# Patient Record
Sex: Female | Born: 1975 | Race: White | Hispanic: No | Smoking: Current every day smoker
Health system: Southern US, Community
[De-identification: ages and names within clinical notes are randomized; demographics above are authoritative.]

## PROBLEM LIST (undated history)

## (undated) DIAGNOSIS — F411 Generalized anxiety disorder: Secondary | ICD-10-CM

## (undated) DIAGNOSIS — T8859XA Other complications of anesthesia, initial encounter: Secondary | ICD-10-CM

## (undated) DIAGNOSIS — K649 Unspecified hemorrhoids: Secondary | ICD-10-CM

## (undated) DIAGNOSIS — F419 Anxiety disorder, unspecified: Secondary | ICD-10-CM

## (undated) DIAGNOSIS — F909 Attention-deficit hyperactivity disorder, unspecified type: Secondary | ICD-10-CM

## (undated) DIAGNOSIS — F329 Major depressive disorder, single episode, unspecified: Secondary | ICD-10-CM

## (undated) DIAGNOSIS — J45909 Unspecified asthma, uncomplicated: Secondary | ICD-10-CM

## (undated) DIAGNOSIS — R102 Pelvic and perineal pain: Secondary | ICD-10-CM

## (undated) DIAGNOSIS — N8003 Adenomyosis of the uterus: Secondary | ICD-10-CM

## (undated) HISTORY — DX: Anxiety disorder, unspecified: F41.9

## (undated) HISTORY — PX: TUBAL LIGATION: SHX77

## (undated) HISTORY — PX: TONSILLECTOMY: SUR1361

## (undated) HISTORY — DX: Unspecified hemorrhoids: K64.9

---

## 1998-08-17 HISTORY — PX: TUBAL LIGATION: SHX77

## 2011-10-15 ENCOUNTER — Emergency Department: Payer: Self-pay | Admitting: Internal Medicine

## 2011-10-15 LAB — URINALYSIS, COMPLETE
Glucose,UR: NEGATIVE mg/dL (ref 0–75)
Ketone: NEGATIVE
Leukocyte Esterase: NEGATIVE
Nitrite: NEGATIVE
Ph: 7 (ref 4.5–8.0)
Protein: NEGATIVE
Squamous Epithelial: 1
WBC UR: 2 /HPF (ref 0–5)

## 2011-10-15 LAB — CBC WITH DIFFERENTIAL/PLATELET
Basophil #: 0.1 10*3/uL (ref 0.0–0.1)
Eosinophil #: 0.1 10*3/uL (ref 0.0–0.7)
HCT: 45 % (ref 35.0–47.0)
Lymphocyte %: 31.8 %
MCH: 31.8 pg (ref 26.0–34.0)
MCHC: 33.8 g/dL (ref 32.0–36.0)
MCV: 94 fL (ref 80–100)
Neutrophil #: 4.5 10*3/uL (ref 1.4–6.5)
Platelet: 268 10*3/uL (ref 150–440)
RDW: 12.5 % (ref 11.5–14.5)

## 2011-10-15 LAB — COMPREHENSIVE METABOLIC PANEL
Albumin: 4.3 g/dL (ref 3.4–5.0)
Alkaline Phosphatase: 70 U/L (ref 50–136)
Anion Gap: 14 (ref 7–16)
BUN: 6 mg/dL — ABNORMAL LOW (ref 7–18)
Bilirubin,Total: 0.2 mg/dL (ref 0.2–1.0)
Calcium, Total: 8.7 mg/dL (ref 8.5–10.1)
Co2: 24 mmol/L (ref 21–32)
Creatinine: 0.7 mg/dL (ref 0.60–1.30)
EGFR (African American): >60
EGFR (Non-African Amer.): >60
Glucose: 81 mg/dL (ref 65–99)
Potassium: 3.5 mmol/L (ref 3.5–5.1)
SGOT(AST): 21 U/L (ref 15–37)

## 2011-10-15 LAB — HCG, QUANTITATIVE, PREGNANCY: Beta Hcg, Quant.: <1 m[IU]/mL — ABNORMAL LOW

## 2016-06-08 DIAGNOSIS — F3489 Other specified persistent mood disorders: Secondary | ICD-10-CM | POA: Diagnosis not present

## 2016-06-08 DIAGNOSIS — F39 Unspecified mood [affective] disorder: Secondary | ICD-10-CM | POA: Diagnosis not present

## 2016-06-24 DIAGNOSIS — F39 Unspecified mood [affective] disorder: Secondary | ICD-10-CM | POA: Diagnosis not present

## 2016-07-29 DIAGNOSIS — F39 Unspecified mood [affective] disorder: Secondary | ICD-10-CM | POA: Diagnosis not present

## 2016-07-30 DIAGNOSIS — F411 Generalized anxiety disorder: Secondary | ICD-10-CM | POA: Diagnosis not present

## 2016-07-30 DIAGNOSIS — F39 Unspecified mood [affective] disorder: Secondary | ICD-10-CM | POA: Diagnosis not present

## 2016-09-28 DIAGNOSIS — F39 Unspecified mood [affective] disorder: Secondary | ICD-10-CM | POA: Diagnosis not present

## 2017-09-29 ENCOUNTER — Emergency Department (HOSPITAL_COMMUNITY)
Admission: EM | Admit: 2017-09-29 | Discharge: 2017-09-29 | Disposition: A | Discharge status: Home / Self Care | Payer: BLUE CROSS/BLUE SHIELD | Attending: Emergency Medicine | Admitting: Emergency Medicine

## 2017-09-29 ENCOUNTER — Other Ambulatory Visit: Payer: Self-pay

## 2017-09-29 ENCOUNTER — Encounter (HOSPITAL_COMMUNITY): Payer: Self-pay | Admitting: *Deleted

## 2017-09-29 ENCOUNTER — Emergency Department (HOSPITAL_COMMUNITY): Payer: BLUE CROSS/BLUE SHIELD

## 2017-09-29 DIAGNOSIS — K921 Melena: Secondary | ICD-10-CM | POA: Insufficient documentation

## 2017-09-29 DIAGNOSIS — F17210 Nicotine dependence, cigarettes, uncomplicated: Secondary | ICD-10-CM | POA: Diagnosis not present

## 2017-09-29 DIAGNOSIS — R109 Unspecified abdominal pain: Secondary | ICD-10-CM | POA: Diagnosis not present

## 2017-09-29 DIAGNOSIS — R1110 Vomiting, unspecified: Secondary | ICD-10-CM | POA: Diagnosis not present

## 2017-09-29 DIAGNOSIS — R1013 Epigastric pain: Secondary | ICD-10-CM | POA: Insufficient documentation

## 2017-09-29 DIAGNOSIS — R112 Nausea with vomiting, unspecified: Secondary | ICD-10-CM | POA: Diagnosis not present

## 2017-09-29 LAB — COMPREHENSIVE METABOLIC PANEL
ALBUMIN: 4 g/dL (ref 3.5–5.0)
ALK PHOS: 85 U/L (ref 38–126)
ALT: 12 U/L — ABNORMAL LOW (ref 14–54)
AST: 18 U/L (ref 15–41)
Anion gap: 10 (ref 5–15)
BUN: 7 mg/dL (ref 6–20)
CALCIUM: 9.3 mg/dL (ref 8.9–10.3)
CHLORIDE: 104 mmol/L (ref 101–111)
CO2: 24 mmol/L (ref 22–32)
Creatinine, Ser: 0.83 mg/dL (ref 0.44–1.00)
GFR calc Af Amer: >60 mL/min (ref >60)
GFR calc non Af Amer: >60 mL/min (ref >60)
GLUCOSE: 112 mg/dL — AB (ref 65–99)
Potassium: 3.8 mmol/L (ref 3.5–5.1)
SODIUM: 138 mmol/L (ref 135–145)
Total Bilirubin: 0.6 mg/dL (ref 0.3–1.2)
Total Protein: 7.5 g/dL (ref 6.5–8.1)

## 2017-09-29 LAB — LIPASE, BLOOD: Lipase: 32 U/L (ref 11–51)

## 2017-09-29 LAB — URINALYSIS, ROUTINE W REFLEX MICROSCOPIC
Bacteria, UA: NONE SEEN
Bilirubin Urine: NEGATIVE
GLUCOSE, UA: NEGATIVE mg/dL
KETONES UR: 20 mg/dL — AB
Leukocytes, UA: NEGATIVE
Nitrite: NEGATIVE
PH: 5 (ref 5.0–8.0)
PROTEIN: NEGATIVE mg/dL
RBC / HPF: NONE SEEN RBC/hpf (ref 0–5)
Specific Gravity, Urine: >1.046 — ABNORMAL HIGH (ref 1.005–1.030)
WBC UA: NONE SEEN WBC/hpf (ref 0–5)

## 2017-09-29 LAB — CBC
HCT: 45.1 % (ref 36.0–46.0)
Hemoglobin: 15.4 g/dL — ABNORMAL HIGH (ref 12.0–15.0)
MCH: 30.9 pg (ref 26.0–34.0)
MCHC: 34.1 g/dL (ref 30.0–36.0)
MCV: 90.6 fL (ref 78.0–100.0)
PLATELETS: 347 10*3/uL (ref 150–400)
RBC: 4.98 MIL/uL (ref 3.87–5.11)
RDW: 14.1 % (ref 11.5–15.5)
WBC: 12.1 10*3/uL — ABNORMAL HIGH (ref 4.0–10.5)

## 2017-09-29 LAB — POC OCCULT BLOOD, ED: FECAL OCCULT BLD: POSITIVE — AB

## 2017-09-29 LAB — I-STAT BETA HCG BLOOD, ED (MC, WL, AP ONLY): I-stat hCG, quantitative: <5 m[IU]/mL (ref <5)

## 2017-09-29 MED ORDER — ONDANSETRON HCL 4 MG/2ML IJ SOLN
4.0000 mg | Freq: Once | INTRAMUSCULAR | Status: AC
  Administered 2017-09-29: 4 mg via INTRAVENOUS
  Filled 2017-09-29: qty 2

## 2017-09-29 MED ORDER — HYDROMORPHONE HCL 1 MG/ML IJ SOLN
0.5000 mg | Freq: Once | INTRAMUSCULAR | Status: AC
  Administered 2017-09-29: 0.5 mg via INTRAVENOUS
  Filled 2017-09-29: qty 1

## 2017-09-29 MED ORDER — ONDANSETRON 4 MG PO TBDP: 4.0000 mg | ORAL_TABLET | Freq: Three times a day (TID) | ORAL | 0 refills | Status: DC | PRN

## 2017-09-29 MED ORDER — GI COCKTAIL ~~LOC~~
30.0000 mL | Freq: Once | ORAL | Status: AC
  Administered 2017-09-29: 30 mL via ORAL
  Filled 2017-09-29: qty 30

## 2017-09-29 MED ORDER — SODIUM CHLORIDE 0.9 % IV BOLUS (SEPSIS)
1000.0000 mL | Freq: Once | INTRAVENOUS | Status: AC
  Administered 2017-09-29: 1000 mL via INTRAVENOUS

## 2017-09-29 MED ORDER — FAMOTIDINE IN NACL 20-0.9 MG/50ML-% IV SOLN
20.0000 mg | Freq: Once | INTRAVENOUS | Status: AC
  Administered 2017-09-29: 20 mg via INTRAVENOUS
  Filled 2017-09-29: qty 50

## 2017-09-29 MED ORDER — IOPAMIDOL (ISOVUE-300) INJECTION 61%
INTRAVENOUS | Status: AC
  Administered 2017-09-29: 100 mL
  Filled 2017-09-29: qty 100

## 2017-09-29 MED ORDER — OMEPRAZOLE 20 MG PO CPDR: 20.0000 mg | DELAYED_RELEASE_CAPSULE | Freq: Every day | ORAL | 0 refills | Status: DC

## 2017-09-29 NOTE — Discharge Instructions (Signed)
Please read instructions below. Schedule an appointment with the Gastroenterologist for further evaluation of your abdominal pain and blood in your stool.  Begin taking omeprazole daily. You can take tylenol as needed for pain. Avoid spicy, acidic, greasy foods. Avoid NSAIDs such as advil/motrin/ibuprofen, aleve, goody's powder, as these can be hard on the stomach lining.  Return to the ER for severely worsening pain, fever, or new or concerning symptoms.

## 2017-09-29 NOTE — ED Notes (Signed)
Patient drank fluid without any incident

## 2017-09-29 NOTE — ED Provider Notes (Signed)
[facility] EMERGENCY DEPARTMENT Provider Note   CSN: [id number] Arrival date & time: 09/29/17  4782     History   Chief Complaint Chief Complaint  Patient presents with  . Abdominal Pain    HPI Sheri Flores is a 42 y.o. female without significant past medical history, presenting to the ED for acute onset of epigastric abdominal pain that began yesterday.  Patient states pain is constant ache/burning and intermittently sharp, worse with meals.  She reports associated nausea and nonbilious/nonbloody emesis.  She also states she had a loose stool this morning that looked very dark/black.  No medications tried for symptoms.  She denies frequent NSAID use, alcohol use, history of GI bleed, or hx of GERD. Does endorse hx of internal hemorrhoids. Only abdominal surgery includes a tubal ligation.  Denies fever or chills, urinary sx, vaginal bleeding or discharge.  The history is provided by the patient.    History reviewed. No pertinent past medical history.  There are no active problems to display for this patient.   History reviewed. No pertinent surgical history.  OB History    No data available       Home Medications    Prior to Admission medications   Medication Sig Start Date End Date Taking? Authorizing Provider  omeprazole (PRILOSEC) 20 MG capsule Take 1 capsule (20 mg total) by mouth daily. 09/29/17   Arleene Settle, [place], PA-C  ondansetron (ZOFRAN ODT) 4 MG disintegrating tablet Take 1 tablet (4 mg total) by mouth every 8 (eight) hours as needed for nausea or vomiting. 09/29/17   Kelsea Mousel, [place], PA-C    Family History History reviewed. No pertinent family history.  Social History Social History   Tobacco Use  . Smoking status: Current Every Day Smoker  Substance Use Topics  . Alcohol use: No    Frequency: Never  . Drug use: No     Allergies   Patient has no known allergies.   Review of Systems Review of Systems  Constitutional:  Negative for chills and fever.  Gastrointestinal: Positive for abdominal pain, blood in stool, nausea and vomiting.  Genitourinary: Negative for dysuria, frequency, vaginal bleeding and vaginal discharge.  All other systems reviewed and are negative.    Physical Exam Updated Vital Signs BP 105/71 (BP Location: Left Arm)   Pulse 84   Temp 98.4 F (36.9 C) (Oral)   Resp 18   LMP 09/22/2017   SpO2 99%   Physical Exam  Constitutional: She appears well-developed and well-nourished. No distress.  HENT:  Head: Normocephalic and atraumatic.  Mouth/Throat: Oropharynx is clear and moist.  Eyes: Conjunctivae are normal.  Cardiovascular: Normal rate, regular rhythm, normal heart sounds and intact distal pulses.  Pulmonary/Chest: Effort normal and breath sounds normal. No respiratory distress.  Abdominal: Soft. Normal appearance and bowel sounds are normal. She exhibits no distension and no mass. There is tenderness in the epigastric area, periumbilical area and left upper quadrant. There is no rigidity, no rebound, no guarding and negative Murphy's sign. No hernia.  Neurological: She is alert.  Skin: Skin is warm.  Psychiatric: She has a normal mood and affect. Her behavior is normal.  Nursing note and vitals reviewed.    ED Treatments / Results  Labs (all labs ordered are listed, but only abnormal results are displayed) Labs Reviewed  COMPREHENSIVE METABOLIC PANEL - Abnormal; Notable for the following components:      Result Value   Glucose, Bld 112 (*)  ALT 12 (*)    All other components within normal limits  CBC - Abnormal; Notable for the following components:   WBC 12.1 (*)    Hemoglobin 15.4 (*)    All other components within normal limits  URINALYSIS, ROUTINE W REFLEX MICROSCOPIC - Abnormal; Notable for the following components:   Specific Gravity, Urine >1.046 (*)    Hgb urine dipstick SMALL (*)    Ketones, ur 20 (*)    Squamous Epithelial / LPF 0-5 (*)    All other  components within normal limits  POC OCCULT BLOOD, ED - Abnormal; Notable for the following components:   Fecal Occult Bld POSITIVE (*)    All other components within normal limits  LIPASE, BLOOD  I-STAT BETA HCG BLOOD, ED ([facility], [facility], [facility] ONLY)    EKG  EKG Interpretation None       Radiology Ct Abdomen Pelvis W Contrast  Result Date: 09/29/2017 CLINICAL DATA:  Mid abdominal pain, nausea, vomiting EXAM: CT ABDOMEN AND PELVIS WITH CONTRAST TECHNIQUE: Multidetector CT imaging of the abdomen and pelvis was performed using the standard protocol following bolus administration of intravenous contrast. CONTRAST:  100mL ISOVUE-300 IOPAMIDOL (ISOVUE-300) INJECTION 61% COMPARISON:  None. FINDINGS: Lower chest: Lung bases are clear. No effusions. Heart is normal size. Hepatobiliary: No focal hepatic abnormality. Gallbladder unremarkable. Pancreas: No focal abnormality or ductal dilatation. Spleen: No focal abnormality.  Normal size. Adrenals/Urinary Tract: No adrenal abnormality. No focal renal abnormality. No stones or hydronephrosis. Urinary bladder is unremarkable. Stomach/Bowel: Stomach, large and small bowel grossly unremarkable. Appendix is normal Vascular/Lymphatic: No evidence of aneurysm or adenopathy. Reproductive: Uterus and adnexa unremarkable.  No mass. Other: No free fluid or free air. Musculoskeletal: No acute bony abnormality. IMPRESSION: No acute findings in the abdomen or pelvis. Electronically Signed   By: Charlett NoseKevin  Dover M.D.   On: 09/29/2017 13:21    Procedures Procedures (including critical care time)  Medications Ordered in ED Medications  sodium chloride 0.9 % bolus 1,000 mL (0 mLs Intravenous Stopped 09/29/17 1341)  ondansetron (ZOFRAN) injection 4 mg (4 mg Intravenous Given 09/29/17 1226)  famotidine (PEPCID) IVPB 20 mg premix (0 mg Intravenous Stopped 09/29/17 1257)  HYDROmorphone (DILAUDID) injection 0.5 mg (0.5 mg Intravenous Given 09/29/17 1227)  iopamidol (ISOVUE-300) 61 %  injection (100 mLs  Contrast Given 09/29/17 1230)  gi cocktail (Maalox,Lidocaine,Donnatal) (30 mLs Oral Given 09/29/17 1345)     Initial Impression / Assessment and Plan / ED Course  I have reviewed the triage vital signs and the nursing notes.  Pertinent labs & imaging results that were available during my care of the patient were reviewed by me and considered in my medical decision making (see chart for details).     Pt presenting with acute onset of epigastric abdominal pain, N/V, with melena. Pt well-appearing, afebrile, not in distress. Abdominal exam with epigastric/LUQ tenderness, no guarding, abdomen soft. Labs with slight leukocytosis, stable hgb, positive hemoccult, U/A neg for infection. CT abd neg for acute pathology. Suspect PUD as cause of abdominal pain and melena. IVF bolus given, pepcid, zofran and dilaudid ordered in triage. Pt tolerating liquids. Will discharge patient with GI referral, and recommendation to take PPI daily. Diet modifications also discussed. Safe for discharge.  Patient discussed with Dr. Phineas RealMabe.  Discussed results, findings, treatment and follow up. Patient advised of return precautions. Patient verbalized understanding and agreed with plan.   Final Clinical Impressions(s) / ED Diagnoses   Final diagnoses:  Melena  Epigastric abdominal pain  ED Discharge Orders        Ordered    omeprazole (PRILOSEC) 20 MG capsule  Daily     09/29/17 1337    ondansetron (ZOFRAN ODT) 4 MG disintegrating tablet  Every 8 hours PRN     09/29/17 1341       Adelaine Roppolo, [place] N, [place] 09/29/17 1420    Mabe, Latanya Maudlin, MD 09/29/17 1421

## 2017-09-29 NOTE — ED Notes (Signed)
D/c reviewed with patient and spouse. No further questions at this time 

## 2017-09-29 NOTE — ED Triage Notes (Signed)
Pt reports onset yesterday of mid abd pain. Has n/v and reports dark loose stools. No acute distress is noted at triage.

## 2017-09-29 NOTE — ED Notes (Signed)
E-signature not available at this time 

## 2017-09-29 NOTE — ED Notes (Signed)
Patient transported to CT 

## 2017-09-29 NOTE — ED Notes (Signed)
ED Provider at bedside. 

## 2017-09-29 NOTE — ED Provider Notes (Signed)
Patient w brief screening exam. C/o epigastric pain, n/v, and states stool looked dark/almost black this AM. No hx gi bleeding. No nsaid use. No etoh abuse. No fever. abd soft mild epigastric tenderness. Stool sent for hemoccult (chaperoned exam). Iv ns bolus. Zofran. Labs sent.    [name], [name], MD 09/29/17 1048

## 2018-04-20 NOTE — Progress Notes (Deleted)
   Subjective:    Patient ID: Sheri Flores, female    DOB: 07/04/1976, 42 y.o.   MRN: [id number]  HPI:  Sheri Flores is here to establish as a new pt.  She is a pleasant 42 tear old female. PMH:  Patient Care Team    Relationship Specialty Notifications Start End  [place], Jinny Blossom, NP PCP - General Family Medicine  03/22/18     There are no active problems to display for this patient.    No past medical history on file.   No past surgical history on file.   No family history on file.   Social History   Substance and Sexual Activity  Drug Use No     Social History   Substance and Sexual Activity  Alcohol Use No  . Frequency: Never     Social History   Tobacco Use  Smoking Status Current Every Day Smoker     Outpatient Encounter Medications as of 04/21/2018  Medication Sig  . omeprazole (PRILOSEC) 20 MG capsule Take 1 capsule (20 mg total) by mouth daily.  . ondansetron (ZOFRAN ODT) 4 MG disintegrating tablet Take 1 tablet (4 mg total) by mouth every 8 (eight) hours as needed for nausea or vomiting.   No facility-administered encounter medications on file as of 04/21/2018.     Allergies: Patient has no known allergies.  There is no height or weight on file to calculate BMI.  There were no vitals taken for this visit.     Review of Systems     Objective:   Physical Exam        Assessment & Plan:  No diagnosis found.  No problem-specific Assessment & Plan notes found for this encounter.    FOLLOW-UP:  No follow-ups on file.

## 2018-04-21 ENCOUNTER — Ambulatory Visit: Payer: BLUE CROSS/BLUE SHIELD | Admitting: Adult Health

## 2018-05-04 NOTE — Progress Notes (Signed)
Subjective:    Patient ID: Sheri Flores, female    DOB: 1976/02/04, 42 y.o.   MRN: [id number]  HPI: Sheri Flores is here to establish as a new pt.  She is a pleasant 42 year old female.   She presents with several acute complaints 1) R flank pain that is constant, described as "throbbing/ache" , rated 8/10 that started 72 hrs ago She also reports urinary frequency, dysuria, and hematuria 2) Diffuse abdominal pain, poor appetite for >4 weeks.  She reports loose stools with intermittent hematochezia that last two weeks. She also reports constant low grade nausea with intermittent vomiting the last two weeks. She denies fever, has been experiencing chills/night sweats.  She has never been evaluated by GI. 3) Long standing tobacco use >20 years, down to 10 cigarettes/day 4) Poor dentition- had all of lower teeth extracted Feb 2019.  Planning on upper row extraction and eventual full dentures. 5) Unintentional weight loss, today's wt 120, she reports her typical wt is 135-140 She denies recent travel outside [place] She denies being exposed to anyone with known TB She is unable to work due to poor dentition [employer] is her primary source of hydration, she denies ETOH use She reports occasional marijuana use, last >4 weeks ago She denies ever using Methamphetamine She reports insomnia She is married with 3 children  Patient Care Team    Relationship Specialty Notifications Start End  [place]ph[place] Battisti, Jinny Blossom, NP PCP - General Family Medicine  03/22/18     Patient Active Problem List   Diagnosis Date Noted  . Healthcare maintenance 05/09/2018  . Loose stools 05/09/2018  . Abnormal urinalysis 05/09/2018     History reviewed. No pertinent past medical history.   Past Surgical History:  Procedure Laterality Date  . TONSILLECTOMY    . TUBAL LIGATION       Family History  Problem Relation Age of Onset  . Cancer Paternal Grandmother        breast     Social History   Substance and  Sexual Activity  Drug Use Yes  . Types: Marijuana     Social History   Substance and Sexual Activity  Alcohol Use No  . Frequency: Never     Social History   Tobacco Use  Smoking Status Current Every Day Smoker  . Packs/day: 0.50  . Years: 20.00  . Pack years: 10.00  . Types: Cigarettes  Smokeless Tobacco Never Used     Outpatient Encounter Medications as of 05/09/2018  Medication Sig  . ondansetron (ZOFRAN-ODT) 4 MG disintegrating tablet Take 1 tablet (4 mg total) by mouth every 8 (eight) hours as needed for nausea or vomiting.  . sulfamethoxazole-trimethoprim (BACTRIM DS,SEPTRA DS) 800-160 MG tablet Take 1 tablet by mouth 2 (two) times daily.  . [DISCONTINUED] omeprazole (PRILOSEC) 20 MG capsule Take 1 capsule (20 mg total) by mouth daily.  . [DISCONTINUED] ondansetron (ZOFRAN ODT) 4 MG disintegrating tablet Take 1 tablet (4 mg total) by mouth every 8 (eight) hours as needed for nausea or vomiting.   No facility-administered encounter medications on file as of 05/09/2018.     Allergies: Hydrocodone  Body mass index is 20.63 kg/m.  Blood pressure 135/86, pulse 81, height 5\' 4"  (1.626 m), weight 120 lb 3.2 oz (54.5 kg), last menstrual period 04/22/2018, SpO2 100 %.  Review of Systems  Constitutional: Positive for activity change, appetite change, chills, fatigue and unexpected weight change. Negative for diaphoresis and fever.  HENT: Positive for dental  problem. Negative for congestion.   Eyes: Negative for visual disturbance.  Respiratory: Negative for cough, chest tightness, shortness of breath, wheezing and stridor.   Cardiovascular: Negative for chest pain, palpitations and leg swelling.  Gastrointestinal: Positive for abdominal pain, blood in stool, diarrhea, nausea and vomiting. Negative for abdominal distention and constipation.  Endocrine: Negative for cold intolerance, heat intolerance, polydipsia, polyphagia and polyuria.  Genitourinary: Positive for  dysuria, frequency, hematuria and urgency.  Neurological: Positive for dizziness. Negative for headaches.  Hematological: Does not bruise/bleed easily.  Psychiatric/Behavioral: Positive for sleep disturbance.       Objective:   Physical Exam  Constitutional: She is oriented to person, place, and time. She appears well-developed and well-nourished. No distress.  HENT:  Head: Normocephalic and atraumatic.  Right Ear: External ear normal.  Left Ear: External ear normal.  Nose: Nose normal.  Mouth/Throat: Oropharynx is clear and moist. She has dentures. Abnormal dentition. Dental caries present.  Eyes: Pupils are equal, round, and reactive to light. Conjunctivae and EOM are normal.  Cardiovascular: Normal rate, regular rhythm, normal heart sounds and intact distal pulses.  No murmur heard. Pulmonary/Chest: Effort normal. No stridor. No respiratory distress. She has wheezes. She has no rales. She exhibits no tenderness.  Abdominal: Soft. Bowel sounds are normal. She exhibits no distension and no mass. There is generalized tenderness. There is CVA tenderness. There is no rebound and no guarding. No hernia.  Neurological: She is alert and oriented to person, place, and time.  Skin: Skin is warm and dry. Capillary refill takes less than 2 seconds. No rash noted. She is not diaphoretic. No erythema. No pallor.  Psychiatric: She has a normal mood and affect. Her behavior is normal. Judgment and thought content normal.  Nursing note and vitals reviewed.     Assessment & Plan:   1. Healthcare maintenance   2. Screening for breast cancer   3. Need for Tdap vaccination   4. Screening for HIV (human immunodeficiency virus)   5. Lower abdominal pain   6. Hematochezia   7. Loose stools   8. Abnormal urinalysis     Abnormal urinalysis UA Blood- small Nit - pos Leu - neg Urine sent for C/S Due to sig flank pain, started on Bactrim 800/160 BID x 7 days Push fluids  Healthcare  maintenance Increase plain water intake and follow BRAT Diet. Please take Bactrim as directed. Please take Ondansetron as needed. Referral to Gastroenterologist placed, re: abdominal pain, loose stools, and poor appetite. We will call you when lab results are available. Follow-up in 4 weeks for complete physical.  Loose stools Referral to GI placed  Pt was in the office today for 45+ minutes, I spent >75% of time in face to face counseling of various medical concerns and in coordination of care  FOLLOW-UP:  Return in about 4 weeks (around 06/06/2018) for CPE.

## 2018-05-09 ENCOUNTER — Ambulatory Visit (INDEPENDENT_AMBULATORY_CARE_PROVIDER_SITE_OTHER): Payer: BLUE CROSS/BLUE SHIELD | Admitting: Adult Health

## 2018-05-09 ENCOUNTER — Other Ambulatory Visit: Payer: Self-pay | Admitting: Adult Health

## 2018-05-09 ENCOUNTER — Encounter: Payer: Self-pay | Admitting: Adult Health

## 2018-05-09 VITALS — BP 135/86 | HR 81 | Ht 64.0 in | Wt 120.2 lb

## 2018-05-09 DIAGNOSIS — R195 Other fecal abnormalities: Secondary | ICD-10-CM | POA: Insufficient documentation

## 2018-05-09 DIAGNOSIS — Z114 Encounter for screening for human immunodeficiency virus [HIV]: Secondary | ICD-10-CM

## 2018-05-09 DIAGNOSIS — Z23 Encounter for immunization: Secondary | ICD-10-CM

## 2018-05-09 DIAGNOSIS — K921 Melena: Secondary | ICD-10-CM

## 2018-05-09 DIAGNOSIS — R1030 Lower abdominal pain, unspecified: Secondary | ICD-10-CM

## 2018-05-09 DIAGNOSIS — Z1231 Encounter for screening mammogram for malignant neoplasm of breast: Secondary | ICD-10-CM | POA: Diagnosis not present

## 2018-05-09 DIAGNOSIS — R8290 Unspecified abnormal findings in urine: Secondary | ICD-10-CM | POA: Insufficient documentation

## 2018-05-09 DIAGNOSIS — Z1239 Encounter for other screening for malignant neoplasm of breast: Secondary | ICD-10-CM

## 2018-05-09 DIAGNOSIS — Z0000 Encounter for general adult medical examination without abnormal findings: Secondary | ICD-10-CM | POA: Insufficient documentation

## 2018-05-09 LAB — POCT URINALYSIS DIPSTICK
Bilirubin, UA: NEGATIVE
Glucose, UA: NEGATIVE
KETONES UA: 15
Leukocytes, UA: NEGATIVE
NITRITE UA: POSITIVE
PH UA: 6 (ref 5.0–8.0)
PROTEIN UA: POSITIVE — AB
UROBILINOGEN UA: 0.2 U/dL

## 2018-05-09 MED ORDER — ONDANSETRON 4 MG PO TBDP: 4.0000 mg | ORAL_TABLET | Freq: Three times a day (TID) | ORAL | 0 refills | Status: DC | PRN

## 2018-05-09 MED ORDER — SULFAMETHOXAZOLE-TRIMETHOPRIM 800-160 MG PO TABS: 1.0000 | ORAL_TABLET | Freq: Two times a day (BID) | ORAL | 0 refills | Status: DC

## 2018-05-09 NOTE — Assessment & Plan Note (Signed)
UA Blood- small Nit - pos Leu - neg Urine sent for C/S Due to sig flank pain, started on Bactrim 800/160 BID x 7 days Push fluids

## 2018-05-09 NOTE — Assessment & Plan Note (Signed)
Referral to GI placed

## 2018-05-09 NOTE — Patient Instructions (Addendum)
Bland Diet A bland diet consists of foods that do not have a lot of fat or fiber. Foods without fat or fiber are easier for the body to digest. They are also less likely to irritate your mouth, throat, stomach, and other parts of your gastrointestinal tract. A bland diet is sometimes called a BRAT diet. What is my plan? Your health care provider or dietitian may recommend specific changes to your diet to prevent and treat your symptoms, such as:  Eating small meals often.  Cooking food until it is soft enough to chew easily.  Chewing your food well.  Drinking fluids slowly.  Not eating foods that are very spicy, sour, or fatty.  Not eating citrus fruits, such as oranges and grapefruit.  What do I need to know about this diet?  Eat a variety of foods from the bland diet food list.  Do not follow a bland diet longer than you have to.  Ask your health care provider whether you should take vitamins. What foods can I eat? Grains  Hot cereals, such as cream of wheat. Bread, crackers, or tortillas made from refined white flour. Rice. Vegetables Canned or cooked vegetables. Mashed or boiled potatoes. Fruits Bananas. Applesauce. Other types of cooked or canned fruit with the skin and seeds removed, such as canned peaches or pears. Meats and Other Protein Sources Scrambled eggs. Creamy peanut butter or other nut butters. Lean, well-cooked meats, such as chicken or fish. Tofu. Soups or broths. Dairy Low-fat dairy products, such as milk, cottage cheese, or yogurt. Beverages Water. Herbal tea. Apple juice. Sweets and Desserts Pudding. Custard. Fruit gelatin. Ice cream. Fats and Oils Mild salad dressings. Canola or olive oil. The items listed above may not be a complete list of allowed foods or beverages. Contact your dietitian for more options. What foods are not recommended? Foods and ingredients that are often not recommended include:  Spicy foods, such as hot sauce or  salsa.  Fried foods.  Sour foods, such as pickled or fermented foods.  Raw vegetables or fruits, especially citrus or berries.  Caffeinated drinks.  Alcohol.  Strongly flavored seasonings or condiments.  The items listed above may not be a complete list of foods and beverages that are not allowed. Contact your dietitian for more information. This information is not intended to replace advice given to you by your health care provider. Make sure you discuss any questions you have with your health care provider. Document Released: 11/25/2015 Document Revised: 01/09/2016 Document Reviewed: 08/15/2014 Elsevier Interactive Patient Education  2018 [employer]   Urinary Frequency, Adult Urinary frequency means urinating more often than usual. People with urinary frequency urinate at least 8 times in 24 hours, even if they drink a normal amount of fluid. Although they urinate more often than normal, the total amount of urine produced in a day may be normal. Urinary frequency is also called pollakiuria. What are the causes? This condition may be caused by:  A urinary tract infection.  Obesity.  Bladder problems, such as bladder stones.  Caffeine or alcohol.  Eating food or drinking fluids that irritate the bladder. These include coffee, tea, soda, artificial sweeteners, citrus, tomato-based foods, and chocolate.  Certain medicines, such as medicines that help the body get rid of extra fluid (diuretics).  Muscle or nerve weakness.  Overactive bladder.  Chronic diabetes.  Interstitial cystitis.  In men, problems with the prostate, such as an enlarged prostate.  In women, pregnancy.  In some cases, the cause may  not be known. What increases the risk? This condition is more likely to develop in:  Women who have gone through menopause.  Men with prostate problems.  People with a disease or injury that affects the nerves or spinal cord.  People who have or have had a  condition that affects the brain, such as a stroke.  What are the signs or symptoms? Symptoms of this condition include:  Feeling an urgent need to urinate often. The stress and anxiety of needing to find a bathroom quickly can make this urge worse.  Urinating 8 or more times in 24 hours.  Urinating as often as every 1 to 2 hours.  How is this diagnosed? This condition is diagnosed based on your symptoms, your medical history, and a physical exam. You may have tests, such as:  Blood tests.  Urine tests.  Imaging tests, such as X-rays or ultrasounds.  A bladder test.  A test of your neurological system. This is the body system that senses the need to urinate.  A test to check for problems in the urethra and bladder called cystoscopy.  You may also be asked to keep a bladder diary. A bladder diary is a record of what you eat and drink, how often you urinate, and how much you urinate. You may need to see a health care provider who specializes in conditions of the urinary tract (urologist) or kidneys (nephrologist). How is this treated? Treatment for this condition depends on the cause. Sometimes the condition goes away on its own and treatment is not necessary. If treatment is needed, it may include:  Taking medicine.  Learning exercises that strengthen the muscles that help control urination.  Following a bladder training program. This may include: ? Learning to delay going to the bathroom. ? Double urinating (voiding). This helps if you are not completely emptying your bladder. ? Scheduled voiding.  Making diet changes, such as: ? Avoiding caffeine. ? Drinking fewer fluids, especially alcohol. ? Not drinking in the evening. ? Not having foods or drinks that may irritate the bladder. ? Eating foods that help prevent or ease constipation. Constipation can make this condition worse.  Having the nerves in your bladder stimulated. There are two options for stimulating the  nerves to your bladder: ? Outpatient electrical nerve stimulation. This is done by your health care provider. ? Surgery to implant a bladder pacemaker. The pacemaker helps to control the urge to urinate.  Follow these instructions at home:  Keep a bladder diary if told to by your health care provider.  Take over-the-counter and prescription medicines only as told by your health care provider.  Do any exercises as told by your health care provider.  Follow a bladder training program as told by your health care provider.  Make any recommended diet changes.  Keep all follow-up visits as told by your health care provider. This is important. Contact a health care provider if:  You start urinating more often.  You feel pain or irritation when you urinate.  You notice blood in your urine.  Your urine looks cloudy.  You develop a fever.  You begin vomiting. Get help right away if:  You are unable to urinate. This information is not intended to replace advice given to you by your health care provider. Make sure you discuss any questions you have with your health care provider. Document Released: 05/30/2009 Document Revised: 09/04/2015 Document Reviewed: 02/27/2015 Elsevier Interactive Patient Education  2018 [employer].  Increase plain water  intake and follow BRAT Diet. Please take Bactrim as directed. Please take Ondansetron as needed. Referral to Gastroenterologist placed, re: abdominal pain, loose stools, and poor appetite. We will call you when lab results are available. Follow-up in 4 weeks for complete physical. WELCOME TO THE PRACTICE!

## 2018-05-09 NOTE — Assessment & Plan Note (Signed)
Increase plain water intake and follow BRAT Diet. Please take Bactrim as directed. Please take Ondansetron as needed. Referral to Gastroenterologist placed, re: abdominal pain, loose stools, and poor appetite. We will call you when lab results are available. Follow-up in 4 weeks for complete physical.

## 2018-05-10 LAB — LIPID PANEL
Chol/HDL Ratio: 4.8 ratio — ABNORMAL HIGH (ref 0.0–4.4)
Cholesterol, Total: 207 mg/dL — ABNORMAL HIGH (ref 100–199)
HDL: 43 mg/dL (ref >39)
LDL Calculated: 140 mg/dL — ABNORMAL HIGH (ref 0–99)
TRIGLYCERIDES: 121 mg/dL (ref 0–149)
VLDL Cholesterol Cal: 24 mg/dL (ref 5–40)

## 2018-05-10 LAB — HIV ANTIBODY (ROUTINE TESTING W REFLEX): HIV SCREEN 4TH GENERATION: NONREACTIVE

## 2018-05-10 LAB — CBC WITH DIFFERENTIAL/PLATELET
BASOS ABS: 0.1 10*3/uL (ref 0.0–0.2)
BASOS: 2 %
EOS (ABSOLUTE): 0.1 10*3/uL (ref 0.0–0.4)
Eos: 2 %
HEMATOCRIT: 44.2 % (ref 34.0–46.6)
Hemoglobin: 14.9 g/dL (ref 11.1–15.9)
IMMATURE GRANULOCYTES: 0 %
Immature Grans (Abs): 0 10*3/uL (ref 0.0–0.1)
LYMPHS: 26 %
Lymphocytes Absolute: 1.1 10*3/uL (ref 0.7–3.1)
MCH: 29.5 pg (ref 26.6–33.0)
MCHC: 33.7 g/dL (ref 31.5–35.7)
MCV: 88 fL (ref 79–97)
Monocytes Absolute: 0.8 10*3/uL (ref 0.1–0.9)
Monocytes: 19 %
NEUTROS PCT: 51 %
Neutrophils Absolute: 2.2 10*3/uL (ref 1.4–7.0)
PLATELETS: 315 10*3/uL (ref 150–450)
RBC: 5.05 x10E6/uL (ref 3.77–5.28)
RDW: 13.5 % (ref 12.3–15.4)
WBC: 4.3 10*3/uL (ref 3.4–10.8)

## 2018-05-10 LAB — COMPREHENSIVE METABOLIC PANEL
ALT: 12 IU/L (ref 0–32)
AST: 13 IU/L (ref 0–40)
Albumin/Globulin Ratio: 1.7 (ref 1.2–2.2)
Albumin: 4.7 g/dL (ref 3.5–5.5)
Alkaline Phosphatase: 101 IU/L (ref 39–117)
BUN/Creatinine Ratio: 13 (ref 9–23)
BUN: 12 mg/dL (ref 6–24)
Bilirubin Total: 0.3 mg/dL (ref 0.0–1.2)
CALCIUM: 9.2 mg/dL (ref 8.7–10.2)
CO2: 23 mmol/L (ref 20–29)
Chloride: 102 mmol/L (ref 96–106)
Creatinine, Ser: 0.92 mg/dL (ref 0.57–1.00)
GFR calc Af Amer: 89 mL/min/{1.73_m2} (ref >59)
GFR, EST NON AFRICAN AMERICAN: 77 mL/min/{1.73_m2} (ref >59)
GLUCOSE: 94 mg/dL (ref 65–99)
Globulin, Total: 2.7 g/dL (ref 1.5–4.5)
Potassium: 3.9 mmol/L (ref 3.5–5.2)
Sodium: 139 mmol/L (ref 134–144)
Total Protein: 7.4 g/dL (ref 6.0–8.5)

## 2018-05-10 LAB — TSH: TSH: 0.714 u[IU]/mL (ref 0.450–4.500)

## 2018-05-10 LAB — HEMOGLOBIN A1C
Est. average glucose Bld gHb Est-mCnc: 105 mg/dL
HEMOGLOBIN A1C: 5.3 % (ref 4.8–5.6)

## 2018-05-12 LAB — CULTURE, URINE COMPREHENSIVE

## 2018-05-19 ENCOUNTER — Encounter: Payer: Self-pay | Admitting: Gastroenterology

## 2018-05-26 ENCOUNTER — Telehealth: Payer: Self-pay

## 2018-05-26 DIAGNOSIS — Z87440 Personal history of urinary (tract) infections: Secondary | ICD-10-CM | POA: Diagnosis not present

## 2018-05-26 DIAGNOSIS — R109 Unspecified abdominal pain: Secondary | ICD-10-CM | POA: Diagnosis not present

## 2018-05-26 DIAGNOSIS — M546 Pain in thoracic spine: Secondary | ICD-10-CM | POA: Diagnosis not present

## 2018-05-26 NOTE — Telephone Encounter (Signed)
Pt called c/o severe flank pain that is not made worse with any particular movements and believes that she has a fever.  Denies any dysuria or frequency.  Advised pt that she should be seen at ED ASAP d/t possible renal calculi and/or sepsis and the need for a CT scan.  Pt expressed understanding and is agreeable.  Tiajuana Amass, CMA

## 2018-06-01 NOTE — Progress Notes (Signed)
Subjective:    Patient ID: Sheri Flores, female    DOB: October 02, 1975, 42 y.o.   MRN: [id number]  HPI:05/09/18 OV: Sheri Flores is here to establish as a new pt.  She is a pleasant 42 year old female.   She presents with several acute complaints 1) R flank pain that is constant, described as "throbbing/ache" , rated 8/10 that started 72 hrs ago She also reports urinary frequency, dysuria, and hematuria 2) Diffuse abdominal pain, poor appetite for >4 weeks.  She reports loose stools with intermittent hematochezia that last two weeks. She also reports constant low grade nausea with intermittent vomiting the last two weeks. She denies fever, has been experiencing chills/night sweats.  She has never been evaluated by GI. 3) Long standing tobacco use >20 years, down to 10 cigarettes/day 4) Poor dentition- had all of lower teeth extracted Feb 2019.  Planning on upper row extraction and eventual full dentures. 5) Unintentional weight loss, today's wt 120, she reports her typical wt is 135-140 She denies recent travel outside [place] She denies being exposed to anyone with known TB She is unable to work due to poor dentition [employer] is her primary source of hydration, she denies ETOH use She reports occasional marijuana use, last >4 weeks ago She denies ever using Methamphetamine She reports insomnia She is married with 3 children  06/06/18 OV: Sheri Flores is here for CPE She was seen in [facility] for possible nephrolithiasis 05/26/18 CT, Xray- neg She was provided Cyclobenzaprine and care instructions for back pain She has been walking 20-28mins every other day and been reducing saturated fat in diet. She continues to experience lumbar back pain/spasm, constant dull ache 5/10 She denies acute accident injury/trauma prior to onset of pain. She denies previous back pain complaints She plans on completing dental work Jan 2020 She is not currently working  Reviewed most recent labs, LDL 140    Healthcare Maintenance: PAP-completed today Mammogram-scheduled  Colonoscopy-N/A Immunizations-UTD  Patient Care Team    Relationship Specialty Notifications Start End  Milessa Hogan, Jinny Blossom, NP PCP - General Family Medicine  03/22/18     Patient Active Problem List   Diagnosis Date Noted  . Acute bilateral low back pain without sciatica 06/06/2018  . Healthcare maintenance 05/09/2018  . Loose stools 05/09/2018  . Abnormal urinalysis 05/09/2018     History reviewed. No pertinent past medical history.   Past Surgical History:  Procedure Laterality Date  . TONSILLECTOMY    . TUBAL LIGATION       Family History  Problem Relation Age of Onset  . Cancer Paternal Grandmother        breast     Social History   Substance and Sexual Activity  Drug Use Yes  . Types: Marijuana     Social History   Substance and Sexual Activity  Alcohol Use No  . Frequency: Never     Social History   Tobacco Use  Smoking Status Current Every Day Smoker  . Packs/day: 0.50  . Years: 20.00  . Pack years: 10.00  . Types: Cigarettes  Smokeless Tobacco Never Used     Outpatient Encounter Medications as of 06/06/2018  Medication Sig  . cyclobenzaprine (FLEXERIL) 10 MG tablet Take 1 tablet (10 mg total) by mouth 3 (three) times daily.  . meloxicam (MOBIC) 7.5 MG tablet Take 1 tablet by mouth 2 (two) times daily.  . ondansetron (ZOFRAN-ODT) 4 MG disintegrating tablet Take 1 tablet (4 mg total) by mouth  every 8 (eight) hours as needed for nausea or vomiting.  . sulfamethoxazole-trimethoprim (BACTRIM DS,SEPTRA DS) 800-160 MG tablet Take 1 tablet by mouth 2 (two) times daily.  . [DISCONTINUED] cyclobenzaprine (FLEXERIL) 10 MG tablet Take 1 tablet by mouth 3 (three) times daily.  Marland Kitchen albuterol (PROVENTIL HFA;VENTOLIN HFA) 108 (90 Base) MCG/ACT inhaler Inhale 2 puffs into the lungs every 6 (six) hours as needed for wheezing or shortness of breath.   No facility-administered encounter  medications on file as of 06/06/2018.     Allergies: Hydrocodone  Body mass index is 22.26 kg/m.  Blood pressure 107/71, pulse 79, height 5\' 4"  (1.626 m), weight 129 lb 11.2 oz (58.8 kg), last menstrual period 04/22/2018, SpO2 99 %.  Review of Systems  Constitutional: Positive for activity change, appetite change, chills, fatigue and unexpected weight change. Negative for diaphoresis and fever.  HENT: Positive for dental problem. Negative for congestion, sinus pain, sore throat, trouble swallowing and voice change.   Eyes: Negative for visual disturbance.  Respiratory: Negative for cough, chest tightness, shortness of breath, wheezing and stridor.   Cardiovascular: Negative for chest pain, palpitations and leg swelling.  Gastrointestinal: Positive for abdominal pain, blood in stool, diarrhea, nausea and vomiting. Negative for abdominal distention and constipation.  Endocrine: Negative for cold intolerance, heat intolerance, polydipsia, polyphagia and polyuria.  Genitourinary: Positive for dysuria, frequency, hematuria and urgency.  Musculoskeletal: Positive for arthralgias, back pain and myalgias.  Neurological: Positive for dizziness. Negative for headaches.  Hematological: Does not bruise/bleed easily.  Psychiatric/Behavioral: Positive for sleep disturbance.       Objective:   Physical Exam  Constitutional: She is oriented to person, place, and time. She appears well-developed and well-nourished. No distress.  HENT:  Head: Normocephalic and atraumatic.  Right Ear: External ear normal. Tympanic membrane is not erythematous and not bulging. No decreased hearing is noted.  Left Ear: External ear normal. Tympanic membrane is not erythematous and not bulging. No decreased hearing is noted.  Nose: Nose normal. No mucosal edema or rhinorrhea. Right sinus exhibits no maxillary sinus tenderness and no frontal sinus tenderness. Left sinus exhibits no maxillary sinus tenderness and no  frontal sinus tenderness.  Mouth/Throat: Oropharynx is clear and moist. Mucous membranes are pale and dry. She has dentures. Abnormal dentition. Dental caries present. Tonsils are 0 on the right. Tonsils are 0 on the left. No tonsillar exudate.  Eyes: Pupils are equal, round, and reactive to light. Conjunctivae and EOM are normal.  Neck: Normal range of motion. Neck supple.  Cardiovascular: Normal rate, regular rhythm, normal heart sounds and intact distal pulses.  No murmur heard. Pulmonary/Chest: Effort normal. No stridor. No respiratory distress. She has wheezes in the right middle field and the left middle field. She has rhonchi. She has no rales. She exhibits no tenderness. Right breast exhibits no inverted nipple, no mass, no nipple discharge, no skin change and no tenderness. Left breast exhibits no inverted nipple, no mass, no nipple discharge, no skin change and no tenderness.  Abdominal: Soft. Bowel sounds are normal. She exhibits no distension and no mass. There is generalized tenderness. There is CVA tenderness. There is no rebound and no guarding. No hernia.  Genitourinary: Pelvic exam was performed with patient supine. Uterus is tender. There is erythema in the vagina.  Genitourinary Comments: Chaperone present during examination.  Musculoskeletal: She exhibits tenderness. She exhibits no edema.       Right hip: Normal.       Left hip: Normal.  Thoracic back: Normal.       Lumbar back: She exhibits tenderness and spasm.  Lymphadenopathy:    She has no cervical adenopathy.  Neurological: She is alert and oriented to person, place, and time. Coordination normal.  Skin: Skin is warm and dry. Capillary refill takes less than 2 seconds. No rash noted. She is not diaphoretic. No erythema. No pallor.  Psychiatric: She has a normal mood and affect. Her behavior is normal. Judgment and thought content normal.  Nursing note and vitals reviewed.     Assessment & Plan:   1. Amenorrhea    2. Screening for cervical cancer   3. Need for Tdap vaccination   4. Acute bilateral low back pain without sciatica   5. Healthcare maintenance   6. Loose stools     Acute bilateral low back pain without sciatica Continue back exercises per ED care instructions Refill on Cyclobenzaprine provided Referral to PT placed  Healthcare maintenance Continue all medications as directed. Continue to drink plenty of water, strive for at least 65 oz/day.  Follow Mediterranean Diet. Increase regular exercise.  Recommend at least 30 minutes daily, 5 days per week of walking, jogging, biking, swimming, YouTube/Pinterest workout videos. Continue back exercises per Emergency Department care instructions. Referral to Physical Therapy placed. We will call you when PAP results are available. Recommend evaluation by Optometrist.  Recommend annual physical with fasting labs.   FOLLOW-UP:  Return in about 1 year (around 06/07/2019) for CPE, Fasting Labs.

## 2018-06-06 ENCOUNTER — Encounter: Payer: Self-pay | Admitting: Adult Health

## 2018-06-06 ENCOUNTER — Other Ambulatory Visit (HOSPITAL_COMMUNITY)
Admission: RE | Admit: 2018-06-06 | Discharge: 2018-06-06 | Disposition: A | Discharge status: Home / Self Care | Payer: BLUE CROSS/BLUE SHIELD | Source: Ambulatory Visit | Attending: Adult Health | Admitting: Adult Health

## 2018-06-06 ENCOUNTER — Ambulatory Visit (INDEPENDENT_AMBULATORY_CARE_PROVIDER_SITE_OTHER): Payer: BLUE CROSS/BLUE SHIELD | Admitting: Adult Health

## 2018-06-06 VITALS — BP 107/71 | HR 79 | Ht 64.0 in | Wt 129.7 lb

## 2018-06-06 DIAGNOSIS — Z124 Encounter for screening for malignant neoplasm of cervix: Secondary | ICD-10-CM | POA: Insufficient documentation

## 2018-06-06 DIAGNOSIS — R195 Other fecal abnormalities: Secondary | ICD-10-CM

## 2018-06-06 DIAGNOSIS — Z23 Encounter for immunization: Secondary | ICD-10-CM

## 2018-06-06 DIAGNOSIS — Z0000 Encounter for general adult medical examination without abnormal findings: Secondary | ICD-10-CM

## 2018-06-06 DIAGNOSIS — M545 Low back pain, unspecified: Secondary | ICD-10-CM | POA: Insufficient documentation

## 2018-06-06 DIAGNOSIS — N912 Amenorrhea, unspecified: Secondary | ICD-10-CM | POA: Diagnosis not present

## 2018-06-06 MED ORDER — CYCLOBENZAPRINE HCL 10 MG PO TABS
10.0000 mg | ORAL_TABLET | Freq: Three times a day (TID) | ORAL | 0 refills | Status: DC
Start: 2018-06-06 — End: 2018-07-13

## 2018-06-06 MED ORDER — ALBUTEROL SULFATE HFA 108 (90 BASE) MCG/ACT IN AERS: 2.0000 | INHALATION_SPRAY | Freq: Four times a day (QID) | RESPIRATORY_TRACT | 0 refills | Status: DC | PRN

## 2018-06-06 NOTE — Patient Instructions (Addendum)
Preventive Care for Adults, Female  A healthy lifestyle and preventive care can promote health and wellness. Preventive health guidelines for women include the following key practices.   A routine yearly physical is a good way to check with your health care provider about your health and preventive screening. It is a chance to share any concerns and updates on your health and to receive a thorough exam.   Visit your dentist for a routine exam and preventive care every 6 months. Brush your teeth twice a day and floss once a day. Good oral hygiene prevents tooth decay and gum disease.   The frequency of eye exams is based on your age, health, family medical history, use of contact lenses, and other factors. Follow your health care provider's recommendations for frequency of eye exams.   Eat a healthy diet. Foods like vegetables, fruits, whole grains, low-fat dairy products, and lean protein foods contain the nutrients you need without too many calories. Decrease your intake of foods high in solid fats, added sugars, and salt. Eat the right amount of calories for you.Get information about a proper diet from your health care provider, if necessary.   Regular physical exercise is one of the most important things you can do for your health. Most adults should get at least 150 minutes of moderate-intensity exercise (any activity that increases your heart rate and causes you to sweat) each week. In addition, most adults need muscle-strengthening exercises on 2 or more days a week.   Maintain a healthy weight. The body mass index (BMI) is a screening tool to identify possible weight problems. It provides an estimate of body fat based on height and weight. Your health care provider can find your BMI, and can help you achieve or maintain a healthy weight.For adults 20 years and older:   - A BMI below 18.5 is considered underweight.   - A BMI of 18.5 to 24.9 is normal.   - A BMI of 25 to 29.9 is  considered overweight.   - A BMI of 30 and above is considered obese.   Maintain normal blood lipids and cholesterol levels by exercising and minimizing your intake of trans and saturated fats.  Eat a balanced diet with plenty of fruit and vegetables. Blood tests for lipids and cholesterol should begin at age 20 and be repeated every 5 years minimum.  If your lipid or cholesterol levels are high, you are over 40, or you are at high risk for heart disease, you may need your cholesterol levels checked more frequently.Ongoing high lipid and cholesterol levels should be treated with medicines if diet and exercise are not working.   If you smoke, find out from your health care provider how to quit. If you do not use tobacco, do not start.   Lung cancer screening is recommended for adults aged 55-80 years who are at high risk for developing lung cancer because of a history of smoking. A yearly low-dose CT scan of the lungs is recommended for people who have at least a 30-pack-year history of smoking and are a current smoker or have quit within the past 15 years. A pack year of smoking is smoking an average of 1 pack of cigarettes a day for 1 year (for example: 1 pack a day for 30 years or 2 packs a day for 15 years). Yearly screening should continue until the smoker has stopped smoking for at least 15 years. Yearly screening should be stopped for people who develop a   health problem that would prevent them from having lung cancer treatment.   If you are pregnant, do not drink alcohol. If you are breastfeeding, be very cautious about drinking alcohol. If you are not pregnant and choose to drink alcohol, do not have more than 1 drink per day. One drink is considered to be 12 ounces (355 mL) of beer, 5 ounces (148 mL) of wine, or 1.5 ounces (44 mL) of liquor.   Avoid use of street drugs. Do not share needles with anyone. Ask for help if you need support or instructions about stopping the use of  drugs.   High blood pressure causes heart disease and increases the risk of stroke. Your blood pressure should be checked at least yearly.  Ongoing high blood pressure should be treated with medicines if weight loss and exercise do not work.   If you are 69-55 years old, ask your health care provider if you should take aspirin to prevent strokes.   Diabetes screening involves taking a blood sample to check your fasting blood sugar level. This should be done once every 3 years, after age 38, if you are within normal weight and without risk factors for diabetes. Testing should be considered at a younger age or be carried out more frequently if you are overweight and have at least 1 risk factor for diabetes.   Breast cancer screening is essential preventive care for women. You should practice "breast self-awareness."  This means understanding the normal appearance and feel of your breasts and may include breast self-examination.  Any changes detected, no matter how small, should be reported to a health care provider.  Women in their 80s and 30s should have a clinical breast exam (CBE) by a health care provider as part of a regular health exam every 1 to 3 years.  After age 66, women should have a CBE every year.  Starting at age 1, women should consider having a mammogram (breast X-ray test) every year.  Women who have a family history of breast cancer should talk to their health care provider about genetic screening.  Women at a high risk of breast cancer should talk to their health care providers about having an MRI and a mammogram every year.   -Breast cancer gene (BRCA)-related cancer risk assessment is recommended for women who have family members with BRCA-related cancers. BRCA-related cancers include breast, ovarian, tubal, and peritoneal cancers. Having family members with these cancers may be associated with an increased risk for harmful changes (mutations) in the breast cancer genes BRCA1 and  BRCA2. Results of the assessment will determine the need for genetic counseling and BRCA1 and BRCA2 testing.   The Pap test is a screening test for cervical cancer. A Pap test can show cell changes on the cervix that might become cervical cancer if left untreated. A Pap test is a procedure in which cells are obtained and examined from the lower end of the uterus (cervix).   - Women should have a Pap test starting at age 57.   - Between ages 90 and 70, Pap tests should be repeated every 2 years.   - Beginning at age 63, you should have a Pap test every 3 years as long as the past 3 Pap tests have been normal.   - Some women have medical problems that increase the chance of getting cervical cancer. Talk to your health care provider about these problems. It is especially important to talk to your health care provider if a  new problem develops soon after your last Pap test. In these cases, your health care provider may recommend more frequent screening and Pap tests.   - The above recommendations are the same for women who have or have not gotten the vaccine for human papillomavirus (HPV).   - If you had a hysterectomy for a problem that was not cancer or a condition that could lead to cancer, then you no longer need Pap tests. Even if you no longer need a Pap test, a regular exam is a good idea to make sure no other problems are starting.   - If you are between ages 58 and 69 years, and you have had normal Pap tests going back 10 years, you no longer need Pap tests. Even if you no longer need a Pap test, a regular exam is a good idea to make sure no other problems are starting.   - If you have had past treatment for cervical cancer or a condition that could lead to cancer, you need Pap tests and screening for cancer for at least 20 years after your treatment.   - If Pap tests have been discontinued, risk factors (such as a new sexual partner) need to be reassessed to determine if screening should  be resumed.   - The HPV test is an additional test that may be used for cervical cancer screening. The HPV test looks for the virus that can cause the cell changes on the cervix. The cells collected during the Pap test can be tested for HPV. The HPV test could be used to screen women aged 23 years and older, and should be used in women of any age who have unclear Pap test results. After the age of 58, women should have HPV testing at the same frequency as a Pap test.   Colorectal cancer can be detected and often prevented. Most routine colorectal cancer screening begins at the age of 22 years and continues through age 65 years. However, your health care provider may recommend screening at an earlier age if you have risk factors for colon cancer. On a yearly basis, your health care provider may provide home test kits to check for hidden blood in the stool.  Use of a small camera at the end of a tube, to directly examine the colon (sigmoidoscopy or colonoscopy), can detect the earliest forms of colorectal cancer. Talk to your health care provider about this at age 35, when routine screening begins. Direct exam of the colon should be repeated every 5 -10 years through age 27 years, unless early forms of pre-cancerous polyps or small growths are found.   People who are at an increased risk for hepatitis B should be screened for this virus. You are considered at high risk for hepatitis B if:  -You were born in a country where hepatitis B occurs often. Talk with your health care provider about which countries are considered high risk.  - Your parents were born in a high-risk country and you have not received a shot to protect against hepatitis B (hepatitis B vaccine).  - You have HIV or AIDS.  - You use needles to inject street drugs.  - You live with, or have sex with, someone who has Hepatitis B.  - You get hemodialysis treatment.  - You take certain medicines for conditions like cancer, organ  transplantation, and autoimmune conditions.   Hepatitis C blood testing is recommended for all people born from [birth year] through 1965 and any individual  with known risks for hepatitis C.   Practice safe sex. Use condoms and avoid high-risk sexual practices to reduce the spread of sexually transmitted infections (STIs). STIs include gonorrhea, chlamydia, syphilis, trichomonas, herpes, HPV, and human immunodeficiency virus (HIV). Herpes, HIV, and HPV are viral illnesses that have no cure. They can result in disability, cancer, and death. Sexually active women aged 57 years and younger should be checked for chlamydia. Older women with new or multiple partners should also be tested for chlamydia. Testing for other STIs is recommended if you are sexually active and at increased risk.   Osteoporosis is a disease in which the bones lose minerals and strength with aging. This can result in serious bone fractures or breaks. The risk of osteoporosis can be identified using a bone density scan. Women ages 64 years and over and women at risk for fractures or osteoporosis should discuss screening with their health care providers. Ask your health care provider whether you should take a calcium supplement or vitamin D to There are also several preventive steps women can take to avoid osteoporosis and resulting fractures or to keep osteoporosis from worsening. -->Recommendations include:  Eat a balanced diet high in fruits, vegetables, calcium, and vitamins.  Get enough calcium. The recommended total intake of is 1,200 mg daily; for best absorption, if taking supplements, divide doses into 250-500 mg doses throughout the day. Of the two types of calcium, calcium carbonate is best absorbed when taken with food but calcium citrate can be taken on an empty stomach.  Get enough vitamin D. [employer] and the [employer] recommend at least 1,000 IU per day for women age 67 and over who are at risk of vitamin D  deficiency. Vitamin D deficiency can be caused by inadequate sun exposure (for example, those who live in [place]).  Avoid alcohol and smoking. Heavy alcohol intake (more than 7 drinks per week) increases the risk of falls and hip fracture and women smokers tend to lose bone more rapidly and have lower bone mass than nonsmokers. Stopping smoking is one of the most important changes women can make to improve their health and decrease risk for disease.  Be physically active every day. Weight-bearing exercise (for example, fast walking, hiking, jogging, and weight training) may strengthen bones or slow the rate of bone loss that comes with aging. Balancing and muscle-strengthening exercises can reduce the risk of falling and fracture.  Consider therapeutic medications. Currently, several types of effective drugs are available. Healthcare providers can recommend the type most appropriate for each woman.  Eliminate environmental factors that may contribute to accidents. Falls cause nearly 90% of all osteoporotic fractures, so reducing this risk is an important bone-health strategy. Measures include ample lighting, removing obstructions to walking, using nonskid rugs on floors, and placing mats and/or grab bars in showers.  Be aware of medication side effects. Some common medicines make bones weaker. These include a type of steroid drug called glucocorticoids used for arthritis and asthma, some antiseizure drugs, certain sleeping pills, treatments for endometriosis, and some cancer drugs. An overactive thyroid gland or using too much thyroid hormone for an underactive thyroid can also be a problem. If you are taking these medicines, talk to your doctor about what you can do to help protect your bones.reduce the rate of osteoporosis.    Menopause can be associated with physical symptoms and risks. Hormone replacement therapy is available to decrease symptoms and risks. You should talk to your  health care provider  about whether hormone replacement therapy is right for you.   Use sunscreen. Apply sunscreen liberally and repeatedly throughout the day. You should seek shade when your shadow is shorter than you. Protect yourself by wearing long sleeves, pants, a wide-brimmed hat, and sunglasses year round, whenever you are outdoors.   Once a month, do a whole body skin exam, using a mirror to look at the skin on your back. Tell your health care provider of new moles, moles that have irregular borders, moles that are larger than a pencil eraser, or moles that have changed in shape or color.   -Stay current with required vaccines (immunizations).   Influenza vaccine. All adults should be immunized every year.  Tetanus, diphtheria, and acellular pertussis (Td, Tdap) vaccine. Pregnant women should receive 1 dose of Tdap vaccine during each pregnancy. The dose should be obtained regardless of the length of time since the last dose. Immunization is preferred during the 27th 36th week of gestation. An adult who has not previously received Tdap or who does not know her vaccine status should receive 1 dose of Tdap. This initial dose should be followed by tetanus and diphtheria toxoids (Td) booster doses every 10 years. Adults with an unknown or incomplete history of completing a 3-dose immunization series with Td-containing vaccines should begin or complete a primary immunization series including a Tdap dose. Adults should receive a Td booster every 10 years.  Varicella vaccine. An adult without evidence of immunity to varicella should receive 2 doses or a second dose if she has previously received 1 dose. Pregnant females who do not have evidence of immunity should receive the first dose after pregnancy. This first dose should be obtained before leaving the health care facility. The second dose should be obtained 4 8 weeks after the first dose.  Human papillomavirus (HPV) vaccine. Females aged 13 26  years who have not received the vaccine previously should obtain the 3-dose series. The vaccine is not recommended for use in pregnant females. However, pregnancy testing is not needed before receiving a dose. If a female is found to be pregnant after receiving a dose, no treatment is needed. In that case, the remaining doses should be delayed until after the pregnancy. Immunization is recommended for any person with an immunocompromised condition through the age of 26 years if she did not get any or all doses earlier. During the 3-dose series, the second dose should be obtained 4 8 weeks after the first dose. The third dose should be obtained 24 weeks after the first dose and 16 weeks after the second dose.  Zoster vaccine. One dose is recommended for adults aged 60 years or older unless certain conditions are present.  Measles, mumps, and rubella (MMR) vaccine. Adults born before 1957 generally are considered immune to measles and mumps. Adults born in 1957 or later should have 1 or more doses of MMR vaccine unless there is a contraindication to the vaccine or there is laboratory evidence of immunity to each of the three diseases. A routine second dose of MMR vaccine should be obtained at least 28 days after the first dose for students attending postsecondary schools, health care workers, or international travelers. People who received inactivated measles vaccine or an unknown type of measles vaccine during 1963 1967 should receive 2 doses of MMR vaccine. People who received inactivated mumps vaccine or an unknown type of mumps vaccine before 1979 and are at high risk for mumps infection should consider immunization with 2 doses of   MMR vaccine. For females of childbearing age, rubella immunity should be determined. If there is no evidence of immunity, females who are not pregnant should be vaccinated. If there is no evidence of immunity, females who are pregnant should delay immunization until after pregnancy.  Unvaccinated health care workers born before [birth year] who lack laboratory evidence of measles, mumps, or rubella immunity or laboratory confirmation of disease should consider measles and mumps immunization with 2 doses of MMR vaccine or rubella immunization with 1 dose of MMR vaccine.  Pneumococcal 13-valent conjugate (PCV13) vaccine. When indicated, a person who is uncertain of her immunization history and has no record of immunization should receive the PCV13 vaccine. An adult aged 54 years or older who has certain medical conditions and has not been previously immunized should receive 1 dose of PCV13 vaccine. This PCV13 should be followed with a dose of pneumococcal polysaccharide (PPSV23) vaccine. The PPSV23 vaccine dose should be obtained at least 8 weeks after the dose of PCV13 vaccine. An adult aged 58 years or older who has certain medical conditions and previously received 1 or more doses of PPSV23 vaccine should receive 1 dose of PCV13. The PCV13 vaccine dose should be obtained 1 or more years after the last PPSV23 vaccine dose.  Pneumococcal polysaccharide (PPSV23) vaccine. When PCV13 is also indicated, PCV13 should be obtained first. All adults aged 58 years and older should be immunized. An adult younger than age 65 years who has certain medical conditions should be immunized. Any person who resides in a nursing home or long-term care facility should be immunized. An adult smoker should be immunized. People with an immunocompromised condition and certain other conditions should receive both PCV13 and PPSV23 vaccines. People with human immunodeficiency virus (HIV) infection should be immunized as soon as possible after diagnosis. Immunization during chemotherapy or radiation therapy should be avoided. Routine use of PPSV23 vaccine is not recommended for American Indians, [place] Natives, or people younger than 65 years unless there are medical conditions that require PPSV23 vaccine. When indicated,  people who have unknown immunization and have no record of immunization should receive PPSV23 vaccine. One-time revaccination 5 years after the first dose of PPSV23 is recommended for people aged 70 64 years who have chronic kidney failure, nephrotic syndrome, asplenia, or immunocompromised conditions. People who received 1 2 doses of PPSV23 before age 32 years should receive another dose of PPSV23 vaccine at age 96 years or later if at least 5 years have passed since the previous dose. Doses of PPSV23 are not needed for people immunized with PPSV23 at or after age 55 years.  Meningococcal vaccine. Adults with asplenia or persistent complement component deficiencies should receive 2 doses of quadrivalent meningococcal conjugate (MenACWY-D) vaccine. The doses should be obtained at least 2 months apart. Microbiologists working with certain meningococcal bacteria, [employer] recruits, people at risk during an outbreak, and people who travel to or live in countries with a high rate of meningitis should be immunized. A first-year college student up through age 58 years who is living in a residence hall should receive a dose if she did not receive a dose on or after her 16th birthday. Adults who have certain high-risk conditions should receive one or more doses of vaccine.  Hepatitis A vaccine. Adults who wish to be protected from this disease, have certain high-risk conditions, work with hepatitis A-infected animals, work in hepatitis A research labs, or travel to or work in countries with a high rate of hepatitis A should be  immunized. Adults who were previously unvaccinated and who anticipate close contact with an international adoptee during the first 60 days after arrival in the [place] from a country with a high rate of hepatitis A should be immunized.  Hepatitis B vaccine.  Adults who wish to be protected from this disease, have certain high-risk conditions, may be exposed to blood or other infectious  body fluids, are household contacts or sex partners of hepatitis B positive people, are clients or workers in certain care facilities, or travel to or work in countries with a high rate of hepatitis B should be immunized.  Haemophilus influenzae type b (Hib) vaccine. A previously unvaccinated person with asplenia or sickle cell disease or having a scheduled splenectomy should receive 1 dose of Hib vaccine. Regardless of previous immunization, a recipient of a hematopoietic stem cell transplant should receive a 3-dose series 6 12 months after her successful transplant. Hib vaccine is not recommended for adults with HIV infection.  Preventive Services / Frequency Ages 6 to 39years  Blood pressure check.** / Every 1 to 2 years.  Lipid and cholesterol check.** / Every 5 years beginning at age 39.  Clinical breast exam.** / Every 3 years for women in their 61s and 62s.  BRCA-related cancer risk assessment.** / For women who have family members with a BRCA-related cancer (breast, ovarian, tubal, or peritoneal cancers).  Pap test.** / Every 2 years from ages 47 through 85. Every 3 years starting at age 34 through age 12 or 74 with a history of 3 consecutive normal Pap tests.  HPV screening.** / Every 3 years from ages 46 through ages 43 to 54 with a history of 3 consecutive normal Pap tests.  Hepatitis C blood test.** / For any individual with known risks for hepatitis C.  Skin self-exam. / Monthly.  Influenza vaccine. / Every year.  Tetanus, diphtheria, and acellular pertussis (Tdap, Td) vaccine.** / Consult your health care provider. Pregnant women should receive 1 dose of Tdap vaccine during each pregnancy. 1 dose of Td every 10 years.  Varicella vaccine.** / Consult your health care provider. Pregnant females who do not have evidence of immunity should receive the first dose after pregnancy.  HPV vaccine. / 3 doses over 6 months, if 64 and younger. The vaccine is not recommended for use in  pregnant females. However, pregnancy testing is not needed before receiving a dose.  Measles, mumps, rubella (MMR) vaccine.** / You need at least 1 dose of MMR if you were born in 1957 or later. You may also need a 2nd dose. For females of childbearing age, rubella immunity should be determined. If there is no evidence of immunity, females who are not pregnant should be vaccinated. If there is no evidence of immunity, females who are pregnant should delay immunization until after pregnancy.  Pneumococcal 13-valent conjugate (PCV13) vaccine.** / Consult your health care provider.  Pneumococcal polysaccharide (PPSV23) vaccine.** / 1 to 2 doses if you smoke cigarettes or if you have certain conditions.  Meningococcal vaccine.** / 1 dose if you are age 71 to 37 years and a Market researcher living in a residence hall, or have one of several medical conditions, you need to get vaccinated against meningococcal disease. You may also need additional booster doses.  Hepatitis A vaccine.** / Consult your health care provider.  Hepatitis B vaccine.** / Consult your health care provider.  Haemophilus influenzae type b (Hib) vaccine.** / Consult your health care provider.  Ages 55 to 64years  Blood pressure check.** / Every 1 to 2 years.  Lipid and cholesterol check.** / Every 5 years beginning at age 20 years.  Lung cancer screening. / Every year if you are aged 55 80 years and have a 30-pack-year history of smoking and currently smoke or have quit within the past 15 years. Yearly screening is stopped once you have quit smoking for at least 15 years or develop a health problem that would prevent you from having lung cancer treatment.  Clinical breast exam.** / Every year after age 40 years.  BRCA-related cancer risk assessment.** / For women who have family members with a BRCA-related cancer (breast, ovarian, tubal, or peritoneal cancers).  Mammogram.** / Every year beginning at age 40  years and continuing for as long as you are in good health. Consult with your health care provider.  Pap test.** / Every 3 years starting at age 30 years through age 65 or 70 years with a history of 3 consecutive normal Pap tests.  HPV screening.** / Every 3 years from ages 30 years through ages 65 to 70 years with a history of 3 consecutive normal Pap tests.  Fecal occult blood test (FOBT) of stool. / Every year beginning at age 50 years and continuing until age 75 years. You may not need to do this test if you get a colonoscopy every 10 years.  Flexible sigmoidoscopy or colonoscopy.** / Every 5 years for a flexible sigmoidoscopy or every 10 years for a colonoscopy beginning at age 50 years and continuing until age 75 years.  Hepatitis C blood test.** / For all people born from 1945 through 1965 and any individual with known risks for hepatitis C.  Skin self-exam. / Monthly.  Influenza vaccine. / Every year.  Tetanus, diphtheria, and acellular pertussis (Tdap/Td) vaccine.** / Consult your health care provider. Pregnant women should receive 1 dose of Tdap vaccine during each pregnancy. 1 dose of Td every 10 years.  Varicella vaccine.** / Consult your health care provider. Pregnant females who do not have evidence of immunity should receive the first dose after pregnancy.  Zoster vaccine.** / 1 dose for adults aged 60 years or older.  Measles, mumps, rubella (MMR) vaccine.** / You need at least 1 dose of MMR if you were born in 1957 or later. You may also need a 2nd dose. For females of childbearing age, rubella immunity should be determined. If there is no evidence of immunity, females who are not pregnant should be vaccinated. If there is no evidence of immunity, females who are pregnant should delay immunization until after pregnancy.  Pneumococcal 13-valent conjugate (PCV13) vaccine.** / Consult your health care provider.  Pneumococcal polysaccharide (PPSV23) vaccine.** / 1 to 2 doses if  you smoke cigarettes or if you have certain conditions.  Meningococcal vaccine.** / Consult your health care provider.  Hepatitis A vaccine.** / Consult your health care provider.  Hepatitis B vaccine.** / Consult your health care provider.  Haemophilus influenzae type b (Hib) vaccine.** / Consult your health care provider.  Ages 65 years and over  Blood pressure check.** / Every 1 to 2 years.  Lipid and cholesterol check.** / Every 5 years beginning at age 20 years.  Lung cancer screening. / Every year if you are aged 55 80 years and have a 30-pack-year history of smoking and currently smoke or have quit within the past 15 years. Yearly screening is stopped once you have quit smoking for at least 15 years or develop a health problem that   would prevent you from having lung cancer treatment.  Clinical breast exam.** / Every year after age 103 years.  BRCA-related cancer risk assessment.** / For women who have family members with a BRCA-related cancer (breast, ovarian, tubal, or peritoneal cancers).  Mammogram.** / Every year beginning at age 36 years and continuing for as long as you are in good health. Consult with your health care provider.  Pap test.** / Every 3 years starting at age 5 years through age 85 or 10 years with 3 consecutive normal Pap tests. Testing can be stopped between 65 and 70 years with 3 consecutive normal Pap tests and no abnormal Pap or HPV tests in the past 10 years.  HPV screening.** / Every 3 years from ages 93 years through ages 70 or 45 years with a history of 3 consecutive normal Pap tests. Testing can be stopped between 65 and 70 years with 3 consecutive normal Pap tests and no abnormal Pap or HPV tests in the past 10 years.  Fecal occult blood test (FOBT) of stool. / Every year beginning at age 8 years and continuing until age 45 years. You may not need to do this test if you get a colonoscopy every 10 years.  Flexible sigmoidoscopy or colonoscopy.** /  Every 5 years for a flexible sigmoidoscopy or every 10 years for a colonoscopy beginning at age 69 years and continuing until age 68 years.  Hepatitis C blood test.** / For all people born from [birth year] through 1965 and any individual with known risks for hepatitis C.  Osteoporosis screening.** / A one-time screening for women ages 7 years and over and women at risk for fractures or osteoporosis.  Skin self-exam. / Monthly.  Influenza vaccine. / Every year.  Tetanus, diphtheria, and acellular pertussis (Tdap/Td) vaccine.** / 1 dose of Td every 10 years.  Varicella vaccine.** / Consult your health care provider.  Zoster vaccine.** / 1 dose for adults aged 5 years or older.  Pneumococcal 13-valent conjugate (PCV13) vaccine.** / Consult your health care provider.  Pneumococcal polysaccharide (PPSV23) vaccine.** / 1 dose for all adults aged 74 years and older.  Meningococcal vaccine.** / Consult your health care provider.  Hepatitis A vaccine.** / Consult your health care provider.  Hepatitis B vaccine.** / Consult your health care provider.  Haemophilus influenzae type b (Hib) vaccine.** / Consult your health care provider. ** Family history and personal history of risk and conditions may change your health care provider's recommendations. Document Released: 09/29/2001 Document Revised: 05/24/2013  [facility] Patient Information 2014 [place], [place].   EXERCISE AND DIET:  We recommended that you start or continue a regular exercise program for good health. Regular exercise means any activity that makes your heart beat faster and makes you sweat.  We recommend exercising at least 30 minutes per day at least 3 days a week, preferably 5.  We also recommend a diet low in fat and sugar / carbohydrates.  Inactivity, poor dietary choices and obesity can cause diabetes, heart attack, stroke, and kidney damage, among others.     ALCOHOL AND SMOKING:  Women should limit their alcohol intake to no  more than 7 drinks/beers/glasses of wine (combined, not each!) per week. Moderation of alcohol intake to this level decreases your risk of breast cancer and liver damage.  ( And of course, no recreational drugs are part of a healthy lifestyle.)  Also, you should not be smoking at all or even being exposed to second hand smoke. Most people know smoking can  cause cancer, and various heart and lung diseases, but did you know it also contributes to weakening of your bones?  Aging of your skin?  Yellowing of your teeth and nails?   CALCIUM AND VITAMIN D:  Adequate intake of calcium and Vitamin D are recommended.  The recommendations for exact amounts of these supplements seem to change often, but generally speaking 600 mg of calcium (either carbonate or citrate) and 800 units of Vitamin D per day seems prudent. Certain women may benefit from higher intake of Vitamin D.  If you are among these women, your doctor will have told you during your visit.     PAP SMEARS:  Pap smears, to check for cervical cancer or precancers,  have traditionally been done yearly, although recent scientific advances have shown that most women can have pap smears less often.  However, every woman still should have a physical exam from her gynecologist or primary care physician every year. It will include a breast check, inspection of the vulva and vagina to check for abnormal growths or skin changes, a visual exam of the cervix, and then an exam to evaluate the size and shape of the uterus and ovaries.  And after 42 years of age, a rectal exam is indicated to check for rectal cancers. We will also provide age appropriate advice regarding health maintenance, like when you should have certain vaccines, screening for sexually transmitted diseases, bone density testing, colonoscopy, mammograms, etc.    MAMMOGRAMS:  All women over 40 years old should have a yearly mammogram. Many facilities now offer a "3D" mammogram, which may cost  around $50 extra out of pocket. If possible,  we recommend you accept the option to have the 3D mammogram performed.  It both reduces the number of women who will be called back for extra views which then turn out to be normal, and it is better than the routine mammogram at detecting truly abnormal areas.     COLONOSCOPY:  Colonoscopy to screen for colon cancer is recommended for all women at age 28.  We know, you hate the idea of the prep.  We agree, BUT, having colon cancer and not knowing it is worse!!  Colon cancer so often starts as a polyp that can be seen and removed at colonscopy, which can quite literally save your life!  And if your first colonoscopy is normal and you have no family history of colon cancer, most women don't have to have it again for 10 years.  Once every ten years, you can do something that may end up saving your life, right?  We will be happy to help you get it scheduled when you are ready.  Be sure to check your insurance coverage so you understand how much it will cost.  It may be covered as a preventative service at no cost, but you should check your particular policy.    Preventive Care for Adults, Female  A healthy lifestyle and preventive care can promote health and wellness. Preventive health guidelines for women include the following key practices.   A routine yearly physical is a good way to check with your health care provider about your health and preventive screening. It is a chance to share any concerns and updates on your health and to receive a thorough exam.   Visit your dentist for a routine exam and preventive care every 6 months. Brush your teeth twice a day and floss once a day. Good oral hygiene prevents tooth decay  and gum disease.   The frequency of eye exams is based on your age, health, family medical history, use of contact lenses, and other factors. Follow your health care provider's recommendations for frequency of eye exams.   Eat a  healthy diet. Foods like vegetables, fruits, whole grains, low-fat dairy products, and lean protein foods contain the nutrients you need without too many calories. Decrease your intake of foods high in solid fats, added sugars, and salt. Eat the right amount of calories for you.Get information about a proper diet from your health care provider, if necessary.   Regular physical exercise is one of the most important things you can do for your health. Most adults should get at least 150 minutes of moderate-intensity exercise (any activity that increases your heart rate and causes you to sweat) each week. In addition, most adults need muscle-strengthening exercises on 2 or more days a week.   Maintain a healthy weight. The body mass index (BMI) is a screening tool to identify possible weight problems. It provides an estimate of body fat based on height and weight. Your health care provider can find your BMI, and can help you achieve or maintain a healthy weight.For adults 20 years and older:   - A BMI below 18.5 is considered underweight.   - A BMI of 18.5 to 24.9 is normal.   - A BMI of 25 to 29.9 is considered overweight.   - A BMI of 30 and above is considered obese.   Maintain normal blood lipids and cholesterol levels by exercising and minimizing your intake of trans and saturated fats.  Eat a balanced diet with plenty of fruit and vegetables. Blood tests for lipids and cholesterol should begin at age 79 and be repeated every 5 years minimum.  If your lipid or cholesterol levels are high, you are over 40, or you are at high risk for heart disease, you may need your cholesterol levels checked more frequently.Ongoing high lipid and cholesterol levels should be treated with medicines if diet and exercise are not working.   If you smoke, find out from your health care provider how to quit. If you do not use tobacco, do not start.   Lung cancer screening is recommended for adults aged 65-80 years  who are at high risk for developing lung cancer because of a history of smoking. A yearly low-dose CT scan of the lungs is recommended for people who have at least a 30-pack-year history of smoking and are a current smoker or have quit within the past 15 years. A pack year of smoking is smoking an average of 1 pack of cigarettes a day for 1 year (for example: 1 pack a day for 30 years or 2 packs a day for 15 years). Yearly screening should continue until the smoker has stopped smoking for at least 15 years. Yearly screening should be stopped for people who develop a health problem that would prevent them from having lung cancer treatment.   If you are pregnant, do not drink alcohol. If you are breastfeeding, be very cautious about drinking alcohol. If you are not pregnant and choose to drink alcohol, do not have more than 1 drink per day. One drink is considered to be 12 ounces (355 mL) of beer, 5 ounces (148 mL) of wine, or 1.5 ounces (44 mL) of liquor.   Avoid use of street drugs. Do not share needles with anyone. Ask for help if you need support or instructions about stopping the  use of drugs.   High blood pressure causes heart disease and increases the risk of stroke. Your blood pressure should be checked at least yearly.  Ongoing high blood pressure should be treated with medicines if weight loss and exercise do not work.   If you are 26-65 years old, ask your health care provider if you should take aspirin to prevent strokes.   Diabetes screening involves taking a blood sample to check your fasting blood sugar level. This should be done once every 3 years, after age 80, if you are within normal weight and without risk factors for diabetes. Testing should be considered at a younger age or be carried out more frequently if you are overweight and have at least 1 risk factor for diabetes.   Breast cancer screening is essential preventive care for women. You should practice "breast  self-awareness."  This means understanding the normal appearance and feel of your breasts and may include breast self-examination.  Any changes detected, no matter how small, should be reported to a health care provider.  Women in their 69s and 30s should have a clinical breast exam (CBE) by a health care provider as part of a regular health exam every 1 to 3 years.  After age 69, women should have a CBE every year.  Starting at age 40, women should consider having a mammogram (breast X-ray test) every year.  Women who have a family history of breast cancer should talk to their health care provider about genetic screening.  Women at a high risk of breast cancer should talk to their health care providers about having an MRI and a mammogram every year.   -Breast cancer gene (BRCA)-related cancer risk assessment is recommended for women who have family members with BRCA-related cancers. BRCA-related cancers include breast, ovarian, tubal, and peritoneal cancers. Having family members with these cancers may be associated with an increased risk for harmful changes (mutations) in the breast cancer genes BRCA1 and BRCA2. Results of the assessment will determine the need for genetic counseling and BRCA1 and BRCA2 testing.   The Pap test is a screening test for cervical cancer. A Pap test can show cell changes on the cervix that might become cervical cancer if left untreated. A Pap test is a procedure in which cells are obtained and examined from the lower end of the uterus (cervix).   - Women should have a Pap test starting at age 68.   - Between ages 29 and 43, Pap tests should be repeated every 2 years.   - Beginning at age 36, you should have a Pap test every 3 years as long as the past 3 Pap tests have been normal.   - Some women have medical problems that increase the chance of getting cervical cancer. Talk to your health care provider about these problems. It is especially important to talk to your health  care provider if a new problem develops soon after your last Pap test. In these cases, your health care provider may recommend more frequent screening and Pap tests.   - The above recommendations are the same for women who have or have not gotten the vaccine for human papillomavirus (HPV).   - If you had a hysterectomy for a problem that was not cancer or a condition that could lead to cancer, then you no longer need Pap tests. Even if you no longer need a Pap test, a regular exam is a good idea to make sure no other problems are  starting.   - If you are between ages 62 and 12 years, and you have had normal Pap tests going back 10 years, you no longer need Pap tests. Even if you no longer need a Pap test, a regular exam is a good idea to make sure no other problems are starting.   - If you have had past treatment for cervical cancer or a condition that could lead to cancer, you need Pap tests and screening for cancer for at least 20 years after your treatment.   - If Pap tests have been discontinued, risk factors (such as a new sexual partner) need to be reassessed to determine if screening should be resumed.   - The HPV test is an additional test that may be used for cervical cancer screening. The HPV test looks for the virus that can cause the cell changes on the cervix. The cells collected during the Pap test can be tested for HPV. The HPV test could be used to screen women aged 63 years and older, and should be used in women of any age who have unclear Pap test results. After the age of 38, women should have HPV testing at the same frequency as a Pap test.   Colorectal cancer can be detected and often prevented. Most routine colorectal cancer screening begins at the age of 34 years and continues through age 38 years. However, your health care provider may recommend screening at an earlier age if you have risk factors for colon cancer. On a yearly basis, your health care provider may provide home  test kits to check for hidden blood in the stool.  Use of a small camera at the end of a tube, to directly examine the colon (sigmoidoscopy or colonoscopy), can detect the earliest forms of colorectal cancer. Talk to your health care provider about this at age 31, when routine screening begins. Direct exam of the colon should be repeated every 5 -10 years through age 56 years, unless early forms of pre-cancerous polyps or small growths are found.   People who are at an increased risk for hepatitis B should be screened for this virus. You are considered at high risk for hepatitis B if:  -You were born in a country where hepatitis B occurs often. Talk with your health care provider about which countries are considered high risk.  - Your parents were born in a high-risk country and you have not received a shot to protect against hepatitis B (hepatitis B vaccine).  - You have HIV or AIDS.  - You use needles to inject street drugs.  - You live with, or have sex with, someone who has Hepatitis B.  - You get hemodialysis treatment.  - You take certain medicines for conditions like cancer, organ transplantation, and autoimmune conditions.   Hepatitis C blood testing is recommended for all people born from [birth year] through 1965 and any individual with known risks for hepatitis C.   Practice safe sex. Use condoms and avoid high-risk sexual practices to reduce the spread of sexually transmitted infections (STIs). STIs include gonorrhea, chlamydia, syphilis, trichomonas, herpes, HPV, and human immunodeficiency virus (HIV). Herpes, HIV, and HPV are viral illnesses that have no cure. They can result in disability, cancer, and death. Sexually active women aged 12 years and younger should be checked for chlamydia. Older women with new or multiple partners should also be tested for chlamydia. Testing for other STIs is recommended if you are sexually active and at increased risk.  Osteoporosis is a disease in which  the bones lose minerals and strength with aging. This can result in serious bone fractures or breaks. The risk of osteoporosis can be identified using a bone density scan. Women ages 8 years and over and women at risk for fractures or osteoporosis should discuss screening with their health care providers. Ask your health care provider whether you should take a calcium supplement or vitamin D to There are also several preventive steps women can take to avoid osteoporosis and resulting fractures or to keep osteoporosis from worsening. -->Recommendations include:  Eat a balanced diet high in fruits, vegetables, calcium, and vitamins.  Get enough calcium. The recommended total intake of is 1,200 mg daily; for best absorption, if taking supplements, divide doses into 250-500 mg doses throughout the day. Of the two types of calcium, calcium carbonate is best absorbed when taken with food but calcium citrate can be taken on an empty stomach.  Get enough vitamin D. [employer] and the [employer] recommend at least 1,000 IU per day for women age 61 and over who are at risk of vitamin D deficiency. Vitamin D deficiency can be caused by inadequate sun exposure (for example, those who live in [place]).  Avoid alcohol and smoking. Heavy alcohol intake (more than 7 drinks per week) increases the risk of falls and hip fracture and women smokers tend to lose bone more rapidly and have lower bone mass than nonsmokers. Stopping smoking is one of the most important changes women can make to improve their health and decrease risk for disease.  Be physically active every day. Weight-bearing exercise (for example, fast walking, hiking, jogging, and weight training) may strengthen bones or slow the rate of bone loss that comes with aging. Balancing and muscle-strengthening exercises can reduce the risk of falling and fracture.  Consider therapeutic medications. Currently, several types of effective  drugs are available. Healthcare providers can recommend the type most appropriate for each woman.  Eliminate environmental factors that may contribute to accidents. Falls cause nearly 90% of all osteoporotic fractures, so reducing this risk is an important bone-health strategy. Measures include ample lighting, removing obstructions to walking, using nonskid rugs on floors, and placing mats and/or grab bars in showers.  Be aware of medication side effects. Some common medicines make bones weaker. These include a type of steroid drug called glucocorticoids used for arthritis and asthma, some antiseizure drugs, certain sleeping pills, treatments for endometriosis, and some cancer drugs. An overactive thyroid gland or using too much thyroid hormone for an underactive thyroid can also be a problem. If you are taking these medicines, talk to your doctor about what you can do to help protect your bones.reduce the rate of osteoporosis.    Menopause can be associated with physical symptoms and risks. Hormone replacement therapy is available to decrease symptoms and risks. You should talk to your health care provider about whether hormone replacement therapy is right for you.   Use sunscreen. Apply sunscreen liberally and repeatedly throughout the day. You should seek shade when your shadow is shorter than you. Protect yourself by wearing long sleeves, pants, a wide-brimmed hat, and sunglasses year round, whenever you are outdoors.   Once a month, do a whole body skin exam, using a mirror to look at the skin on your back. Tell your health care provider of new moles, moles that have irregular borders, moles that are larger than a pencil eraser, or moles that have changed in shape or color.   -  Stay current with required vaccines (immunizations).   Influenza vaccine. All adults should be immunized every year.  Tetanus, diphtheria, and acellular pertussis (Td, Tdap) vaccine. Pregnant women should receive 1  dose of Tdap vaccine during each pregnancy. The dose should be obtained regardless of the length of time since the last dose. Immunization is preferred during the 27th 36th week of gestation. An adult who has not previously received Tdap or who does not know her vaccine status should receive 1 dose of Tdap. This initial dose should be followed by tetanus and diphtheria toxoids (Td) booster doses every 10 years. Adults with an unknown or incomplete history of completing a 3-dose immunization series with Td-containing vaccines should begin or complete a primary immunization series including a Tdap dose. Adults should receive a Td booster every 10 years.  Varicella vaccine. An adult without evidence of immunity to varicella should receive 2 doses or a second dose if she has previously received 1 dose. Pregnant females who do not have evidence of immunity should receive the first dose after pregnancy. This first dose should be obtained before leaving the health care facility. The second dose should be obtained 4 8 weeks after the first dose.  Human papillomavirus (HPV) vaccine. Females aged 68 26 years who have not received the vaccine previously should obtain the 3-dose series. The vaccine is not recommended for use in pregnant females. However, pregnancy testing is not needed before receiving a dose. If a female is found to be pregnant after receiving a dose, no treatment is needed. In that case, the remaining doses should be delayed until after the pregnancy. Immunization is recommended for any person with an immunocompromised condition through the age of 71 years if she did not get any or all doses earlier. During the 3-dose series, the second dose should be obtained 4 8 weeks after the first dose. The third dose should be obtained 24 weeks after the first dose and 16 weeks after the second dose.  Zoster vaccine. One dose is recommended for adults aged 33 years or older unless certain conditions are  present.  Measles, mumps, and rubella (MMR) vaccine. Adults born before [birth year] generally are considered immune to measles and mumps. Adults born in [birth year] or later should have 1 or more doses of MMR vaccine unless there is a contraindication to the vaccine or there is laboratory evidence of immunity to each of the three diseases. A routine second dose of MMR vaccine should be obtained at least 28 days after the first dose for students attending postsecondary schools, health care workers, or international travelers. People who received inactivated measles vaccine or an unknown type of measles vaccine during 1963 1967 should receive 2 doses of MMR vaccine. People who received inactivated mumps vaccine or an unknown type of mumps vaccine before 1979 and are at high risk for mumps infection should consider immunization with 2 doses of MMR vaccine. For females of childbearing age, rubella immunity should be determined. If there is no evidence of immunity, females who are not pregnant should be vaccinated. If there is no evidence of immunity, females who are pregnant should delay immunization until after pregnancy. Unvaccinated health care workers born before 25 who lack laboratory evidence of measles, mumps, or rubella immunity or laboratory confirmation of disease should consider measles and mumps immunization with 2 doses of MMR vaccine or rubella immunization with 1 dose of MMR vaccine.  Pneumococcal 13-valent conjugate (PCV13) vaccine. When indicated, a person who is uncertain of her immunization  history and has no record of immunization should receive the PCV13 vaccine. An adult aged 4 years or older who has certain medical conditions and has not been previously immunized should receive 1 dose of PCV13 vaccine. This PCV13 should be followed with a dose of pneumococcal polysaccharide (PPSV23) vaccine. The PPSV23 vaccine dose should be obtained at least 8 weeks after the dose of PCV13 vaccine. An adult aged 8  years or older who has certain medical conditions and previously received 1 or more doses of PPSV23 vaccine should receive 1 dose of PCV13. The PCV13 vaccine dose should be obtained 1 or more years after the last PPSV23 vaccine dose.  Pneumococcal polysaccharide (PPSV23) vaccine. When PCV13 is also indicated, PCV13 should be obtained first. All adults aged 21 years and older should be immunized. An adult younger than age 42 years who has certain medical conditions should be immunized. Any person who resides in a nursing home or long-term care facility should be immunized. An adult smoker should be immunized. People with an immunocompromised condition and certain other conditions should receive both PCV13 and PPSV23 vaccines. People with human immunodeficiency virus (HIV) infection should be immunized as soon as possible after diagnosis. Immunization during chemotherapy or radiation therapy should be avoided. Routine use of PPSV23 vaccine is not recommended for American Indians, [place] Natives, or people younger than 65 years unless there are medical conditions that require PPSV23 vaccine. When indicated, people who have unknown immunization and have no record of immunization should receive PPSV23 vaccine. One-time revaccination 5 years after the first dose of PPSV23 is recommended for people aged 27 64 years who have chronic kidney failure, nephrotic syndrome, asplenia, or immunocompromised conditions. People who received 1 2 doses of PPSV23 before age 46 years should receive another dose of PPSV23 vaccine at age 66 years or later if at least 5 years have passed since the previous dose. Doses of PPSV23 are not needed for people immunized with PPSV23 at or after age 39 years.  Meningococcal vaccine. Adults with asplenia or persistent complement component deficiencies should receive 2 doses of quadrivalent meningococcal conjugate (MenACWY-D) vaccine. The doses should be obtained at least 2 months apart.  Microbiologists working with certain meningococcal bacteria, [place] recruits, people at risk during an outbreak, and people who travel to or live in countries with a high rate of meningitis should be immunized. A first-year college student up through age 64 years who is living in a residence hall should receive a dose if she did not receive a dose on or after her 16th birthday. Adults who have certain high-risk conditions should receive one or more doses of vaccine.  Hepatitis A vaccine. Adults who wish to be protected from this disease, have certain high-risk conditions, work with hepatitis A-infected animals, work in hepatitis A research labs, or travel to or work in countries with a high rate of hepatitis A should be immunized. Adults who were previously unvaccinated and who anticipate close contact with an international adoptee during the first 60 days after arrival in the [place] from a country with a high rate of hepatitis A should be immunized.  Hepatitis B vaccine.  Adults who wish to be protected from this disease, have certain high-risk conditions, may be exposed to blood or other infectious body fluids, are household contacts or sex partners of hepatitis B positive people, are clients or workers in certain care facilities, or travel to or work in countries with a high rate of hepatitis B should be immunized.  Haemophilus influenzae type b (Hib) vaccine. A previously unvaccinated person with asplenia or sickle cell disease or having a scheduled splenectomy should receive 1 dose of Hib vaccine. Regardless of previous immunization, a recipient of a hematopoietic stem cell transplant should receive a 3-dose series 6 12 months after her successful transplant. Hib vaccine is not recommended for adults with HIV infection.  Preventive Services / Frequency Ages 58 to 39years  Blood pressure check.** / Every 1 to 2 years.  Lipid and cholesterol check.** / Every 5 years beginning at age  44.  Clinical breast exam.** / Every 3 years for women in their 56s and 52s.  BRCA-related cancer risk assessment.** / For women who have family members with a BRCA-related cancer (breast, ovarian, tubal, or peritoneal cancers).  Pap test.** / Every 2 years from ages 59 through 15. Every 3 years starting at age 16 through age 1 or 37 with a history of 3 consecutive normal Pap tests.  HPV screening.** / Every 3 years from ages 47 through ages 55 to 64 with a history of 3 consecutive normal Pap tests.  Hepatitis C blood test.** / For any individual with known risks for hepatitis C.  Skin self-exam. / Monthly.  Influenza vaccine. / Every year.  Tetanus, diphtheria, and acellular pertussis (Tdap, Td) vaccine.** / Consult your health care provider. Pregnant women should receive 1 dose of Tdap vaccine during each pregnancy. 1 dose of Td every 10 years.  Varicella vaccine.** / Consult your health care provider. Pregnant females who do not have evidence of immunity should receive the first dose after pregnancy.  HPV vaccine. / 3 doses over 6 months, if 36 and younger. The vaccine is not recommended for use in pregnant females. However, pregnancy testing is not needed before receiving a dose.  Measles, mumps, rubella (MMR) vaccine.** / You need at least 1 dose of MMR if you were born in 1957 or later. You may also need a 2nd dose. For females of childbearing age, rubella immunity should be determined. If there is no evidence of immunity, females who are not pregnant should be vaccinated. If there is no evidence of immunity, females who are pregnant should delay immunization until after pregnancy.  Pneumococcal 13-valent conjugate (PCV13) vaccine.** / Consult your health care provider.  Pneumococcal polysaccharide (PPSV23) vaccine.** / 1 to 2 doses if you smoke cigarettes or if you have certain conditions.  Meningococcal vaccine.** / 1 dose if you are age 31 to 14 years and a [place] living in a residence hall, or have one of several medical conditions, you need to get vaccinated against meningococcal disease. You may also need additional booster doses.  Hepatitis A vaccine.** / Consult your health care provider.  Hepatitis B vaccine.** / Consult your health care provider.  Haemophilus influenzae type b (Hib) vaccine.** / Consult your health care provider.  Ages 84 to 64years  Blood pressure check.** / Every 1 to 2 years.  Lipid and cholesterol check.** / Every 5 years beginning at age 67 years.  Lung cancer screening. / Every year if you are aged 26 80 years and have a 30-pack-year history of smoking and currently smoke or have quit within the past 15 years. Yearly screening is stopped once you have quit smoking for at least 15 years or develop a health problem that would prevent you from having lung cancer treatment.  Clinical breast exam.** / Every year after age 20 years.  BRCA-related cancer risk assessment.** / For women who  have family members with a BRCA-related cancer (breast, ovarian, tubal, or peritoneal cancers).  Mammogram.** / Every year beginning at age 65 years and continuing for as long as you are in good health. Consult with your health care provider.  Pap test.** / Every 3 years starting at age 9 years through age 4 or 29 years with a history of 3 consecutive normal Pap tests.  HPV screening.** / Every 3 years from ages 73 years through ages 79 to 13 years with a history of 3 consecutive normal Pap tests.  Fecal occult blood test (FOBT) of stool. / Every year beginning at age 38 years and continuing until age 79 years. You may not need to do this test if you get a colonoscopy every 10 years.  Flexible sigmoidoscopy or colonoscopy.** / Every 5 years for a flexible sigmoidoscopy or every 10 years for a colonoscopy beginning at age 26 years and continuing until age 60 years.  Hepatitis C blood test.** / For all people born from [birth year]  through 1965 and any individual with known risks for hepatitis C.  Skin self-exam. / Monthly.  Influenza vaccine. / Every year.  Tetanus, diphtheria, and acellular pertussis (Tdap/Td) vaccine.** / Consult your health care provider. Pregnant women should receive 1 dose of Tdap vaccine during each pregnancy. 1 dose of Td every 10 years.  Varicella vaccine.** / Consult your health care provider. Pregnant females who do not have evidence of immunity should receive the first dose after pregnancy.  Zoster vaccine.** / 1 dose for adults aged 31 years or older.  Measles, mumps, rubella (MMR) vaccine.** / You need at least 1 dose of MMR if you were born in 1957 or later. You may also need a 2nd dose. For females of childbearing age, rubella immunity should be determined. If there is no evidence of immunity, females who are not pregnant should be vaccinated. If there is no evidence of immunity, females who are pregnant should delay immunization until after pregnancy.  Pneumococcal 13-valent conjugate (PCV13) vaccine.** / Consult your health care provider.  Pneumococcal polysaccharide (PPSV23) vaccine.** / 1 to 2 doses if you smoke cigarettes or if you have certain conditions.  Meningococcal vaccine.** / Consult your health care provider.  Hepatitis A vaccine.** / Consult your health care provider.  Hepatitis B vaccine.** / Consult your health care provider.  Haemophilus influenzae type b (Hib) vaccine.** / Consult your health care provider.  Ages 11 years and over  Blood pressure check.** / Every 1 to 2 years.  Lipid and cholesterol check.** / Every 5 years beginning at age 85 years.  Lung cancer screening. / Every year if you are aged 2 80 years and have a 30-pack-year history of smoking and currently smoke or have quit within the past 15 years. Yearly screening is stopped once you have quit smoking for at least 15 years or develop a health problem that would prevent you from having lung  cancer treatment.  Clinical breast exam.** / Every year after age 61 years.  BRCA-related cancer risk assessment.** / For women who have family members with a BRCA-related cancer (breast, ovarian, tubal, or peritoneal cancers).  Mammogram.** / Every year beginning at age 26 years and continuing for as long as you are in good health. Consult with your health care provider.  Pap test.** / Every 3 years starting at age 76 years through age 43 or 17 years with 3 consecutive normal Pap tests. Testing can be stopped between 65 and 70 years with 3  consecutive normal Pap tests and no abnormal Pap or HPV tests in the past 10 years.  HPV screening.** / Every 3 years from ages 30 years through ages 50 or 25 years with a history of 3 consecutive normal Pap tests. Testing can be stopped between 65 and 70 years with 3 consecutive normal Pap tests and no abnormal Pap or HPV tests in the past 10 years.  Fecal occult blood test (FOBT) of stool. / Every year beginning at age 44 years and continuing until age 85 years. You may not need to do this test if you get a colonoscopy every 10 years.  Flexible sigmoidoscopy or colonoscopy.** / Every 5 years for a flexible sigmoidoscopy or every 10 years for a colonoscopy beginning at age 64 years and continuing until age 10 years.  Hepatitis C blood test.** / For all people born from [birth year] through 1965 and any individual with known risks for hepatitis C.  Osteoporosis screening.** / A one-time screening for women ages 44 years and over and women at risk for fractures or osteoporosis.  Skin self-exam. / Monthly.  Influenza vaccine. / Every year.  Tetanus, diphtheria, and acellular pertussis (Tdap/Td) vaccine.** / 1 dose of Td every 10 years.  Varicella vaccine.** / Consult your health care provider.  Zoster vaccine.** / 1 dose for adults aged 83 years or older.  Pneumococcal 13-valent conjugate (PCV13) vaccine.** / Consult your health care provider.  Pneumococcal  polysaccharide (PPSV23) vaccine.** / 1 dose for all adults aged 52 years and older.  Meningococcal vaccine.** / Consult your health care provider.  Hepatitis A vaccine.** / Consult your health care provider.  Hepatitis B vaccine.** / Consult your health care provider.  Haemophilus influenzae type b (Hib) vaccine.** / Consult your health care provider. ** Family history and personal history of risk and conditions may change your health care provider's recommendations. Document Released: 09/29/2001 Document Revised: 05/24/2013  [facility] Patient Information 2014 [place], [place].   EXERCISE AND DIET:  We recommended that you start or continue a regular exercise program for good health. Regular exercise means any activity that makes your heart beat faster and makes you sweat.  We recommend exercising at least 30 minutes per day at least 3 days a week, preferably 5.  We also recommend a diet low in fat and sugar / carbohydrates.  Inactivity, poor dietary choices and obesity can cause diabetes, heart attack, stroke, and kidney damage, among others.     ALCOHOL AND SMOKING:  Women should limit their alcohol intake to no more than 7 drinks/beers/glasses of wine (combined, not each!) per week. Moderation of alcohol intake to this level decreases your risk of breast cancer and liver damage.  ( And of course, no recreational drugs are part of a healthy lifestyle.)  Also, you should not be smoking at all or even being exposed to second hand smoke. Most people know smoking can cause cancer, and various heart and lung diseases, but did you know it also contributes to weakening of your bones?  Aging of your skin?  Yellowing of your teeth and nails?   CALCIUM AND VITAMIN D:  Adequate intake of calcium and Vitamin D are recommended.  The recommendations for exact amounts of these supplements seem to change often, but generally speaking 600 mg of calcium (either carbonate or citrate) and 800 units of Vitamin D  per day seems prudent. Certain women may benefit from higher intake of Vitamin D.  If you are among these women, your doctor will have  told you during your visit.     PAP SMEARS:  Pap smears, to check for cervical cancer or precancers,  have traditionally been done yearly, although recent scientific advances have shown that most women can have pap smears less often.  However, every woman still should have a physical exam from her gynecologist or primary care physician every year. It will include a breast check, inspection of the vulva and vagina to check for abnormal growths or skin changes, a visual exam of the cervix, and then an exam to evaluate the size and shape of the uterus and ovaries.  And after 42 years of age, a rectal exam is indicated to check for rectal cancers. We will also provide age appropriate advice regarding health maintenance, like when you should have certain vaccines, screening for sexually transmitted diseases, bone density testing, colonoscopy, mammograms, etc.    MAMMOGRAMS:  All women over 86 years old should have a yearly mammogram. Many facilities now offer a "3D" mammogram, which may cost around $50 extra out of pocket. If possible,  we recommend you accept the option to have the 3D mammogram performed.  It both reduces the number of women who will be called back for extra views which then turn out to be normal, and it is better than the routine mammogram at detecting truly abnormal areas.     COLONOSCOPY:  Colonoscopy to screen for colon cancer is recommended for all women at age 39.  We know, you hate the idea of the prep.  We agree, BUT, having colon cancer and not knowing it is worse!!  Colon cancer so often starts as a polyp that can be seen and removed at colonscopy, which can quite literally save your life!  And if your first colonoscopy is normal and you have no family history of colon cancer, most women don't have to have it again for 10 years.  Once every ten  years, you can do something that may end up saving your life, right?  We will be happy to help you get it scheduled when you are ready.  Be sure to check your insurance coverage so you understand how much it will cost.  It may be covered as a preventative service at no cost, but you should check your particular policy.    Mediterranean Diet A Mediterranean diet refers to food and lifestyle choices that are based on the traditions of countries located on the [employer]. This way of eating has been shown to help prevent certain conditions and improve outcomes for people who have chronic diseases, like kidney disease and heart disease. What are tips for following this plan? Lifestyle  Cook and eat meals together with your family, when possible.  Drink enough fluid to keep your urine clear or pale yellow.  Be physically active every day. This includes: ? Aerobic exercise like running or swimming. ? Leisure activities like gardening, walking, or housework.  Get 7-8 hours of sleep each night.  If recommended by your health care provider, drink red wine in moderation. This means 1 glass a day for nonpregnant women and 2 glasses a day for men. A glass of wine equals 5 oz (150 mL). Reading food labels  Check the serving size of packaged foods. For foods such as rice and pasta, the serving size refers to the amount of cooked product, not dry.  Check the total fat in packaged foods. Avoid foods that have saturated fat or trans fats.  Check the ingredients list for  added sugars, such as corn syrup. Shopping  At the grocery store, buy most of your food from the areas near the walls of the store. This includes: ? Fresh fruits and vegetables (produce). ? Grains, beans, nuts, and seeds. Some of these may be available in unpackaged forms or large amounts (in bulk). ? Fresh seafood. ? Poultry and eggs. ? Low-fat dairy products.  Buy whole ingredients instead of prepackaged foods.  Buy fresh  fruits and vegetables in-season from local farmers markets.  Buy frozen fruits and vegetables in resealable bags.  If you do not have access to quality fresh seafood, buy precooked frozen shrimp or canned fish, such as tuna, salmon, or sardines.  Buy small amounts of raw or cooked vegetables, salads, or olives from the deli or salad bar at your store.  Stock your pantry so you always have certain foods on hand, such as olive oil, canned tuna, canned tomatoes, rice, pasta, and beans. Cooking  Cook foods with extra-virgin olive oil instead of using butter or other vegetable oils.  Have meat as a side dish, and have vegetables or grains as your main dish. This means having meat in small portions or adding small amounts of meat to foods like pasta or stew.  Use beans or vegetables instead of meat in common dishes like chili or lasagna.  Experiment with different cooking methods. Try roasting or broiling vegetables instead of steaming or sauteing them.  Add frozen vegetables to soups, stews, pasta, or rice.  Add nuts or seeds for added healthy fat at each meal. You can add these to yogurt, salads, or vegetable dishes.  Marinate fish or vegetables using olive oil, lemon juice, garlic, and fresh herbs. Meal planning  Plan to eat 1 vegetarian meal one day each week. Try to work up to 2 vegetarian meals, if possible.  Eat seafood 2 or more times a week.  Have healthy snacks readily available, such as: ? Vegetable sticks with hummus. ? [place] yogurt. ? Fruit and nut trail mix.  Eat balanced meals throughout the week. This includes: ? Fruit: 2-3 servings a day ? Vegetables: 4-5 servings a day ? Low-fat dairy: 2 servings a day ? Fish, poultry, or lean meat: 1 serving a day ? Beans and legumes: 2 or more servings a week ? Nuts and seeds: 1-2 servings a day ? Whole grains: 6-8 servings a day ? Extra-virgin olive oil: 3-4 servings a day  Limit red meat and sweets to only a few servings  a month What are my food choices?  Mediterranean diet ? Recommended ? Grains: Whole-grain pasta. Brown rice. Bulgar wheat. Polenta. Couscous. Whole-wheat bread. [place]. ? Vegetables: Artichokes. Beets. Broccoli. Cabbage. Carrots. Eggplant. Green beans. Chard. Kale. Spinach. Onions. Leeks. Peas. Squash. Tomatoes. Peppers. Radishes. ? Fruits: Apples. Apricots. Avocado. Berries. Bananas. Cherries. Dates. Figs. Grapes. Lemons. Melon. Oranges. Peaches. Plums. Pomegranate. ? Meats and other protein foods: Beans. Almonds. Sunflower seeds. Pine nuts. Peanuts. [place]. Salmon. Scallops. Shrimp. [place]. Tilapia. Clams. Oysters. Eggs. ? Dairy: Low-fat milk. Cheese. Greek yogurt. ? Beverages: Water. Red wine. Herbal tea. ? Fats and oils: Extra virgin olive oil. Avocado oil. Grape seed oil. ? Sweets and desserts: [place] yogurt with honey. Baked apples. Poached pears. Trail mix. ? Seasoning and other foods: Basil. Cilantro. Coriander. Cumin. Mint. Parsley. Sage. Rosemary. Tarragon. Garlic. Oregano. Thyme. Pepper. Balsalmic vinegar. Tahini. Hummus. Tomato sauce. Olives. Mushrooms. ? Limit these ? Grains: Prepackaged pasta or rice dishes. Prepackaged cereal with added sugar. ? Vegetables: Deep fried  potatoes (french fries). ? Fruits: Fruit canned in syrup. ? Meats and other protein foods: Beef. Pork. Lamb. Poultry with skin. Hot dogs. Berniece Salines. ? Dairy: Ice cream. Sour cream. Whole milk. ? Beverages: Juice. Sugar-sweetened soft drinks. Beer. Liquor and spirits. ? Fats and oils: Butter. Canola oil. Vegetable oil. Beef fat (tallow). Lard. ? Sweets and desserts: Cookies. Cakes. Pies. Candy. ? Seasoning and other foods: Mayonnaise. Premade sauces and marinades. ? The items listed may not be a complete list. Talk with your dietitian about what dietary choices are right for you. Summary  The Mediterranean diet includes both food and lifestyle choices.  Eat a variety of fresh fruits and vegetables, beans, nuts,  seeds, and whole grains.  Limit the amount of red meat and sweets that you eat.  Talk with your health care provider about whether it is safe for you to drink red wine in moderation. This means 1 glass a day for nonpregnant women and 2 glasses a day for men. A glass of wine equals 5 oz (150 mL). This information is not intended to replace advice given to you by your health care provider. Make sure you discuss any questions you have with your health care provider. Document Released: 03/26/2016 Document Revised: 04/28/2016 Document Reviewed: 03/26/2016 Elsevier Interactive Patient Education  2018 Bee all medications as directed. Continue to drink plenty of water, strive for at least 65 oz/day.  Follow Mediterranean Diet. Increase regular exercise.  Recommend at least 30 minutes daily, 5 days per week of walking, jogging, biking, swimming, YouTube/Pinterest workout videos. Continue back exercises per Emergency Department care instructions. Referral to Physical Therapy placed. We will call you when PAP results are available. Recommend evaluation by Optometrist.  Recommend annual physical with fasting labs. NICE TO SEE YOU!

## 2018-06-06 NOTE — Assessment & Plan Note (Signed)
Continue all medications as directed. Continue to drink plenty of water, strive for at least 65 oz/day.  Follow Mediterranean Diet. Increase regular exercise.  Recommend at least 30 minutes daily, 5 days per week of walking, jogging, biking, swimming, YouTube/Pinterest workout videos. Continue back exercises per Emergency Department care instructions. Referral to Physical Therapy placed. We will call you when PAP results are available. Recommend evaluation by Optometrist.  Recommend annual physical with fasting labs.

## 2018-06-06 NOTE — Assessment & Plan Note (Signed)
Continue back exercises per ED care instructions Refill on Cyclobenzaprine provided Referral to PT placed

## 2018-06-08 ENCOUNTER — Other Ambulatory Visit: Payer: Self-pay | Admitting: Adult Health

## 2018-06-08 DIAGNOSIS — Z0000 Encounter for general adult medical examination without abnormal findings: Secondary | ICD-10-CM

## 2018-06-08 LAB — CYTOLOGY - PAP: DIAGNOSIS: NEGATIVE

## 2018-06-08 NOTE — Progress Notes (Unsigned)
OB/GYN referral placed to address abnormal vaginal anatomy

## 2018-06-09 ENCOUNTER — Telehealth: Payer: Self-pay | Admitting: Adult Health

## 2018-06-09 NOTE — Telephone Encounter (Signed)
Addressed with pt.  Tiajuana Amass, CMA

## 2018-06-09 NOTE — Telephone Encounter (Signed)
Patient cld concerned that she had rcvd a call from an OB/GYN office to set up appt.-- Pt wondered if something wrong with her and request nurse call her back w/ Lab results .  --forwarding message to medical assistant to update patient on results to ease her mind that nothing is wrong with her.  --glh

## 2018-06-13 ENCOUNTER — Ambulatory Visit: Payer: BLUE CROSS/BLUE SHIELD

## 2018-06-17 ENCOUNTER — Ambulatory Visit
Admission: RE | Admit: 2018-06-17 | Discharge: 2018-06-17 | Disposition: A | Discharge status: Home / Self Care | Payer: BLUE CROSS/BLUE SHIELD | Source: Ambulatory Visit | Attending: Adult Health | Admitting: Adult Health

## 2018-06-17 DIAGNOSIS — Z1231 Encounter for screening mammogram for malignant neoplasm of breast: Secondary | ICD-10-CM | POA: Diagnosis not present

## 2018-06-21 ENCOUNTER — Other Ambulatory Visit: Payer: Self-pay | Admitting: Adult Health

## 2018-06-21 DIAGNOSIS — R928 Other abnormal and inconclusive findings on diagnostic imaging of breast: Secondary | ICD-10-CM

## 2018-06-22 ENCOUNTER — Ambulatory Visit: Payer: BLUE CROSS/BLUE SHIELD | Admitting: Gastroenterology

## 2018-06-27 ENCOUNTER — Ambulatory Visit: Payer: BLUE CROSS/BLUE SHIELD | Admitting: Gynecology

## 2018-06-27 ENCOUNTER — Encounter: Payer: Self-pay | Admitting: Gynecology

## 2018-06-27 VITALS — BP 118/74 | Ht 64.0 in | Wt 130.0 lb

## 2018-06-27 DIAGNOSIS — N898 Other specified noninflammatory disorders of vagina: Secondary | ICD-10-CM

## 2018-06-27 DIAGNOSIS — R87618 Other abnormal cytological findings on specimens from cervix uteri: Secondary | ICD-10-CM

## 2018-06-27 LAB — WET PREP FOR TRICH, YEAST, CLUE

## 2018-06-27 NOTE — Progress Notes (Signed)
    Sheri Flores [birth year] [id number]        42 y.o.  [id number] new patient presents for evaluation.  Saw her primary physician for exam where Pap smear was normal cytology but said "foreign material is present".  Patient is not having any issues with vaginal discharge or other symptoms.  Regular monthly menses using tubal sterilization.  Past medical history,surgical history, problem list, medications, allergies, family history and social history were all reviewed and documented in the EPIC chart.  Directed ROS with pertinent positives and negatives documented in the history of present illness/assessment and plan.  Exam: Kennon Portela assistant Vitals:   06/27/18 1049  BP: 118/74  Weight: 130 lb (59 kg)  Height: 5\' 4"  (1.626 m)   General appearance:  Normal Abdomen soft nontender without masses guarding rebound Pelvic external BUS vagina normal.  No visual or palpable abnormalities or foreign bodies.  Cervix normal.  Uterus normal size midline mobile nontender.  Adnexa without masses or tenderness.  Rectal exam is normal noting attenuated rectal sphincter.  Colposcopy performed after acetic acid cleanse from the cervix to the vaginal opening with no abnormalities visualized.  Transformation zone visualized 360 degrees with no abnormalities noted.  Assessment/Plan:  42 y.o. [id number] with reported foreign material on Pap smear.  Cytology otherwise normal.  Physical exam is normal.  Wet prep was done which is negative.  Patient does use occasional lubricants with intercourse.  Question whether this is what was seen on the Pap smear.  Regardless no abnormalities on exam and no further evaluation needed at this time.  Patient was reassured and she will follow-up if she has any gynecologic issues.    Dara Lords MD, 11:18 AM 06/27/2018

## 2018-06-27 NOTE — Patient Instructions (Signed)
Follow-up routinely for annual gynecologic exam.

## 2018-06-28 ENCOUNTER — Other Ambulatory Visit: Payer: Self-pay | Admitting: Adult Health

## 2018-06-28 ENCOUNTER — Ambulatory Visit
Admission: RE | Admit: 2018-06-28 | Discharge: 2018-06-28 | Disposition: A | Discharge status: Home / Self Care | Payer: BLUE CROSS/BLUE SHIELD | Source: Ambulatory Visit | Attending: Adult Health | Admitting: Adult Health

## 2018-06-28 DIAGNOSIS — R928 Other abnormal and inconclusive findings on diagnostic imaging of breast: Secondary | ICD-10-CM

## 2018-06-28 DIAGNOSIS — R921 Mammographic calcification found on diagnostic imaging of breast: Secondary | ICD-10-CM

## 2018-06-28 DIAGNOSIS — N6002 Solitary cyst of left breast: Secondary | ICD-10-CM | POA: Diagnosis not present

## 2018-07-11 ENCOUNTER — Telehealth: Payer: Self-pay | Admitting: Adult Health

## 2018-07-11 NOTE — Telephone Encounter (Signed)
Requesting call back from clinic staff

## 2018-07-11 NOTE — Telephone Encounter (Signed)
Pt c/o cough and col symptoms.  Pt is requesting cough medication.  Advised pt that she will need OV.  Pt expressed understanding and is agreeable.  Pt transferred to front desk to schedule appt.  Sheri Flores. Terique Kawabata, CMA

## 2018-07-13 ENCOUNTER — Encounter: Payer: Self-pay | Admitting: Family Medicine

## 2018-07-13 ENCOUNTER — Ambulatory Visit (INDEPENDENT_AMBULATORY_CARE_PROVIDER_SITE_OTHER): Payer: BLUE CROSS/BLUE SHIELD | Admitting: Family Medicine

## 2018-07-13 VITALS — BP 148/90 | HR 78 | Temp 98.5°F | Ht 64.0 in | Wt 129.0 lb

## 2018-07-13 DIAGNOSIS — R062 Wheezing: Secondary | ICD-10-CM

## 2018-07-13 DIAGNOSIS — J440 Chronic obstructive pulmonary disease with acute lower respiratory infection: Secondary | ICD-10-CM | POA: Diagnosis not present

## 2018-07-13 DIAGNOSIS — Z716 Tobacco abuse counseling: Secondary | ICD-10-CM

## 2018-07-13 DIAGNOSIS — J019 Acute sinusitis, unspecified: Secondary | ICD-10-CM

## 2018-07-13 DIAGNOSIS — J209 Acute bronchitis, unspecified: Secondary | ICD-10-CM

## 2018-07-13 DIAGNOSIS — R0989 Other specified symptoms and signs involving the circulatory and respiratory systems: Secondary | ICD-10-CM | POA: Diagnosis not present

## 2018-07-13 MED ORDER — PREDNISONE 20 MG PO TABS: ORAL_TABLET | ORAL | 0 refills | Status: DC

## 2018-07-13 MED ORDER — AMOXICILLIN-POT CLAVULANATE 875-125 MG PO TABS: 1.0000 | ORAL_TABLET | Freq: Two times a day (BID) | ORAL | 0 refills | Status: AC

## 2018-07-13 MED ORDER — HYDROCOD POLST-CPM POLST ER 10-8 MG/5ML PO SUER: 5.0000 mL | Freq: Two times a day (BID) | ORAL | 0 refills | Status: DC | PRN

## 2018-07-13 MED ORDER — ALBUTEROL SULFATE HFA 108 (90 BASE) MCG/ACT IN AERS: 2.0000 | INHALATION_SPRAY | Freq: Four times a day (QID) | RESPIRATORY_TRACT | 0 refills | Status: DC | PRN

## 2018-07-13 NOTE — Patient Instructions (Signed)
I want you to follow-up in 3 to 4 weeks with your PCP Katie for further evaluation to make sure the right side of your lung fields have cleared and symptoms are resolved.    You can also call 1-800-QUIT-NOW 406-415-4616(1-9093905554-669) for free smoking cessation counseling and support.     Also, please go online to www.heart.org (the American Heart Association website) and search "quit smoking ".     Or try the centers for disease control website at: http://bell-fernandez.com/https://www.cdc.gov/tobacco/campaign/tips/quit-smoking  Or, go to the "national cancer institute" web site of NIH:  TodayAlert.dehttps://www.cancer.gov/publications/patient-education/clearing-the-air-pdf  There is a lot of great information on these websites for you to look over.   Want to Quit Smoking? FDA-Approved Products Can Help  Quitting smoking can be hard, but it is possible. In fact, every time you put out a cigarette is a new chance to try quitting again, according to the U.S. Food and Drug Administration's newest tobacco education campaign, "Every Try Counts."   If you want to quit-almost 70 percent of adult smokers say they do-you may want to use a "smoking cessation" product proven to help. Data has shown that using FDA-approved cessation medicine can double your chance of quitting successfully.  Some products contain nicotine as an active ingredient and others do not. These products include over-the-counter (OTC) options like skin patches, lozenges, and gum, as well as prescription medicines.  Smoking cessation products are intended to help you quit smoking. They are regulated through the [facility]'s Center for Drug Evaluation and Research, which ensures that the products are safe and effective and that their benefits outweigh any known associated risks.  The Benefits of Quitting Smoking No matter how much you smoke-or for how long-quitting will benefit you.  Not only will you lower your risk of getting various cancers, including lung cancer, you'll also  reduce your chances of having heart disease, a stroke, emphysema, and other serious diseases. Quitting also will lower the risk of heart disease and lung cancer in nonsmokers who otherwise would be exposed to your secondhand smoke.  Although there are benefits to quitting at any age, it is important to quit as soon as possible so your body can begin to heal from the damage caused by smoking. For instance, 12 hours after you quit smoking the carbon monoxide level in your blood drops to normal. Carbon monoxide is harmful because it displaces oxygen in the blood and deprives your heart, brain, and other vital organs of oxygen.  What To Know About Smoking Cessation Products Understanding how smoking cessation products work-and what side effects they may cause-can help you determine which product may be best for you.  If you're considering one of these products, reading labels and talking to your pharmacist and other health care providers are good first steps to take.  You also can check the FDA's website for more information on each product at Drugs@FDA , where you can search for each product by name.  And remember to weigh each product's benefits and risks, among other considerations.  About Nicotine Replacement Therapy (NRT) Nicotine is the substance primarily responsible for causing addiction to tobacco products. Tobacco users who are addicted to nicotine are used to having nicotine in their bodies.  As you try to quit smoking, you may have symptoms of nicotine withdrawal. When you quit, this withdrawal may cause symptoms like cravings, or urges, to smoke; depression; trouble sleeping; irritability; anxiety; and increased appetite.  Nicotine withdrawal can discourage some smokers from continuing with a quit attempt. But the  FDA has approved several smoking cessation products designed to help users gradually withdraw from smoking (that is, "wean" themselves from smoking) by using specific amounts of  nicotine that decrease over time. This type of product is called a "nicotine replacement therapy" or NRT. It supplies nicotine in controlled amounts while sparing you from other chemicals found in tobacco products.  NRTs are available over the counter and by prescription. You should generally use them only for a short time to help you manage nicotine cravings and withdrawal. However, the FDA recognizes that some people may need to use these products longer to stay smoke-free. Talk to your health care provider to determine the best course of treatment for you.  Over-the-counter NRTs are approved for sale to people age 42 and older. They are available under various brand names and sometimes as generic products. They include:  - Skin patches (also called "transdermal nicotine patches"). These patches are placed on the skin, similar to how you would apply an adhesive bandage. - Chewing gum (also called "nicotine gum"). This gum must be chewed according to the labeled instructions to be effective. - Lozenges (also called "nicotine lozenges"). You use these products by dissolving them in your mouth. For over-the-counter products, it's important to follow the instructions on the Drug Facts Label (DFL) and to read the enclosed User's Guide for complete directions and other important information. Ask your health care provider if you have questions.  Currently, prescription nicotine replacement therapy is available only under the brand name Nicotrol, and is available both as a nasal spray and an oral inhaler. The products are FDA-approved only for use by adults.  If you are under age 53 and want to quit smoking, talk to a health care professional about whether you should use nicotine replacement therapies.  Important Advice for People Considering Nicotine Replacement Therapy Women who are pregnant or breastfeeding should talk to their health care providers and use nicotine replacement products only if the  health care providers approve.  Also talk to your health care provider before using these products if you have:  diabetes, heart disease, asthma, or stomach ulcers; had a recent heart attack; high blood pressure that is not controlled with medicine; a history of irregular heartbeat; or been prescribed medication to help you quit smoking. If you take prescription medication for depression or asthma, tell your health care provider if you are quitting smoking because he or she may need to change your prescription dose.  Stop using a nicotine replacement product and call your health care professional if you have any of the following symptoms: nausea; dizziness; weakness; vomiting; fast or irregular heartbeat; mouth problems with the lozenge or gum; or redness or swelling of the skin around the patch that does not go away.  About Prescription Cessation Medicines Without Nicotine  The FDA has approved two smoking cessation products that do not contain nicotine. They are Chantix (varenicline tartrate) and Zyban (buproprion hydrochloride). Both are available in tablet form and by prescription only.  Chantix acts at sites in the brain affected by nicotine by reducing the rewarding effects of nicotine. The precise way that Zyban helps with smoking cessation is unknown.  As with other prescription products, the FDA has evaluated these medicines and found that the benefits outweigh the risks. For users taking these products, risks include changes in behavior, depressed mood, hostility, aggression, and suicidal thoughts or actions.  The most common side effects of Chantix include nausea; constipation; gas; vomiting; and trouble sleeping or vivid,  unusual, or strange dreams. Chantix also may change how you react to alcohol, so talk to your health care provider about your drinking habits (if you drink alcohol) and whether these habits need to change. Chantix is not recommended for people under the age  of 53.  The most commonly observed side effects consistently associated with the use of Zyban are dry mouth and insomnia.  Because Zyban contains the same active ingredient as the antidepressant Wellbutrin (bupropion), the FDA encourages people who use Zyban-and those who are considering it-to talk to their health care providers about the risks of treatment with antidepressant medicines. Zyban has not been studied in children under the age of 74 and is not approved for use in children and teenagers.  Note: If your health care provider prescribes Chantix or Zyban, please read the product's patient medication guide in its entirety. These guides offer important information on side effects, risks, warnings, product ingredients, and what you should talk about with your health care provider before taking the products.  Finally, if you ever have any side effects related to any smoking cessation products, or have any other problems related to your treatment, the FDA would like to hear from you. Please consider making a voluntary and confidential report to the FDA's MedWatch program.  Updated: July 27, 2016   Acute Bronchitis, Adult Acute bronchitis is sudden (acute) swelling of the air tubes (bronchi) in the lungs. Acute bronchitis causes these tubes to fill with mucus, which can make it hard to breathe. It can also cause coughing or wheezing. In adults, acute bronchitis usually goes away within 2 weeks. A cough caused by bronchitis may last up to 3 weeks. Smoking, allergies, and asthma can make the condition worse. Repeated episodes of bronchitis may cause further lung problems, such as chronic obstructive pulmonary disease (COPD). What are the causes? This condition can be caused by germs and by substances that irritate the lungs, including:  Cold and flu viruses. This condition is most often caused by the same virus that causes a cold.  Bacteria.  Exposure to tobacco smoke, dust, fumes, and air  pollution.  What increases the risk? This condition is more likely to develop in people who:  Have close contact with someone with acute bronchitis.  Are exposed to lung irritants, such as tobacco smoke, dust, fumes, and vapors.  Have a weak immune system.  Have a respiratory condition such as asthma.  What are the signs or symptoms? Symptoms of this condition include:  A cough.  Coughing up clear, yellow, or green mucus.  Wheezing.  Chest congestion.  Shortness of breath.  A fever.  Body aches.  Chills.  A sore throat.  How is this diagnosed? This condition is usually diagnosed with a physical exam. During the exam, your health care provider may order tests, such as chest X-rays, to rule out other conditions. He or she may also:  Test a sample of your mucus for bacterial infection.  Check the level of oxygen in your blood. This is done to check for pneumonia.  Do a chest X-ray or lung function testing to rule out pneumonia and other conditions.  Perform blood tests.  Your health care provider will also ask about your symptoms and medical history. How is this treated? Most cases of acute bronchitis clear up over time without treatment. Your health care provider may recommend:  Drinking more fluids. Drinking more makes your mucus thinner, which may make it easier to breathe.  Taking a medicine  for a fever or cough.  Taking an antibiotic medicine.  Using an inhaler to help improve shortness of breath and to control a cough.  Using a cool mist vaporizer or humidifier to make it easier to breathe.  Follow these instructions at home: Medicines  Take over-the-counter and prescription medicines only as told by your health care provider.  If you were prescribed an antibiotic, take it as told by your health care provider. Do not stop taking the antibiotic even if you start to feel better. General instructions  Get plenty of rest.  Drink enough fluids to  keep your urine clear or pale yellow.  Avoid smoking and secondhand smoke. Exposure to cigarette smoke or irritating chemicals will make bronchitis worse. If you smoke and you need help quitting, ask your health care provider. Quitting smoking will help your lungs heal faster.  Use an inhaler, cool mist vaporizer, or humidifier as told by your health care provider.  Keep all follow-up visits as told by your health care provider. This is important. How is this prevented? To lower your risk of getting this condition again:  Wash your hands often with soap and water. If soap and water are not available, use hand sanitizer.  Avoid contact with people who have cold symptoms.  Try not to touch your hands to your mouth, nose, or eyes.  Make sure to get the flu shot every year.  Contact a health care provider if:  Your symptoms do not improve in 2 weeks of treatment. Get help right away if:  You cough up blood.  You have chest pain.  You have severe shortness of breath.  You become dehydrated.  You faint or keep feeling like you are going to faint.  You keep vomiting.  You have a severe headache.  Your fever or chills gets worse. This information is not intended to replace advice given to you by your health care provider. Make sure you discuss any questions you have with your health care provider. Document Released: 09/10/2004 Document Revised: 02/26/2016 Document Reviewed: 01/22/2016 Elsevier Interactive Patient Education  [employer].

## 2018-07-13 NOTE — Progress Notes (Signed)
Acute Care Office visit  Assessment and plan:  1. Acute sinusitis, recurrence not specified, unspecified location   2. Acute bronchitis ( with likely underlying COPD)    3. Tobacco abuse counseling   4. Rhonchi at right lung base> than upper R middle   5. Wheezes     - Viral vs Allergic vs Bacterial causes for pt's symptoms reveiwed.     - Supportive care and various OTC medications discussed in addition to any prescribed.  - Steroids and antibiotics provided today.  - Per patient, she has taken codeine with tylenol in the past and done fine with this.  Patient denies history of drug abuse and confirms that she can take codeine without complication.  - Advised patient please follow up with her PCP in 3-4 weeks.  Advised patient to ask her PCP to listen to the entire right side of her chest to make sure it clears up.  If it doesn't clear up, recommend obtaining X-ray and/or CT scan in near future, to compare with X-ray obtained in [place] in September.  - Unhappy with the way the entire right side of the lungs sounded; concerns for need for pulmonary function testing, chest X-ray, or CT scan in the near future.  - Strongly encouraged patient to continue goal to quit smoking.  - Call or RTC if new symptoms, or if no improvement or worse over next several days.    Meds ordered this encounter  Medications  . albuterol (PROVENTIL HFA;VENTOLIN HFA) 108 (90 Base) MCG/ACT inhaler    Sig: Inhale 2 puffs into the lungs every 6 (six) hours as needed for wheezing or shortness of breath.    Dispense:  1 Inhaler    Refill:  0  . amoxicillin-clavulanate (AUGMENTIN) 875-125 MG tablet    Sig: Take 1 tablet by mouth 2 (two) times daily for 10 days.    Dispense:  20 tablet    Refill:  0  . predniSONE (DELTASONE) 20 MG tablet    Sig: Take 3 pills a day for 2 days, 2 pills a day for 2 days, 1 pill a day for 2 days then one half pill a day for 2 days then off    Dispense:  14 tablet   Refill:  0  . chlorpheniramine-HYDROcodone (TUSSIONEX) 10-8 MG/5ML SUER    Sig: Take 5 mLs by mouth every 12 (twelve) hours as needed for cough (cough, will cause drowsiness.).    Dispense:  200 mL    Refill:  0    Per patient, she has taken codeine in the past and hydrocodone type products and done fine with     Medications Discontinued During This Encounter  Medication Reason  . cyclobenzaprine (FLEXERIL) 10 MG tablet Completed Course  . albuterol (PROVENTIL HFA;VENTOLIN HFA) 108 (90 Base) MCG/ACT inhaler Reorder     Gross side effects, risk and benefits, and alternatives of medications discussed with patient.  Patient is aware that all medications have potential side effects and we are unable to predict every sideeffect or drug-drug interaction that may occur.  Expresses verbal understanding and consents to current therapy plan and treatment regiment.   Education and routine counseling performed. Handouts provided.  Anticipatory guidance and routine counseling done re: condition, txmnt options and need for follow up. All questions of patient's were answered.  Return for NEEDS Follow-up with Katie 3-4 weeks.  Please see AVS handed out to patient at the end of our visit for additional patient instructions/ counseling  done pertaining to today's office visit.  Note:  This document was partially repared using Dragon voice recognition software and may include unintentional dictation errors.  This document serves as a record of services personally performed by Thomasene Lot, DO. It was created on her behalf by Peggye Fothergill, a trained medical scribe. The creation of this record is based on the scribe's personal observations and the provider's statements to them.   I have reviewed the above medical documentation for accuracy and completeness and I concur.  Thomasene Lot, DO 07/13/2018 4:26 PM       Subjective:    Chief Complaint  Patient presents with  . Cough     HPI:  Pt presents with Sx for 5-6 days.   C/o:  Started Friday, coughing and congestion, stuffy nose, runny nose.  Symptoms started with a slight cough, worsening.  "More at night when I'm laying down, I just start coughing and coughing and coughing, and then my ears hurt."  Over the past couple of days, she has been experiencing more congestion in her chest, and more coughing.  Feels tightness and pain in her chest when she breathes in.  Feels she is "wheezing again" with her current illness.  As quit smoking for ten days now.  Had an X-ray at the [facility] in September due to suspected pneumonia.  She did not have pneumonia at that time.  Denies:  Patient denies history of drug abuse and notes that she is able to take codeine medications today without complication.    For symptoms patient has tried:  Has used an inhaler for her wheezing in the past.  Has not recently been on inhaler or antibiotics.  Overall getting:  Overall feeling a little better, but worse at night.    Patient Care Team    Relationship Specialty Notifications Start End  Hudson, Orpha Bur D, NP PCP - General Family Medicine  03/22/18     Past medical history, Surgical history, Family history reviewed and noted below, Social history, Allergies, and Medications have been entered into the medical record, reviewed and changed as needed.   Allergies  Allergen Reactions  . Hydrocodone Itching    Review of Systems: - see above HPI for pertinent positives General:   No F/C, wt loss Pulm:   + coughing, wheezing, pain on inhalation due to wheezing Card:  No CP, palpitations Abd:  No n/v/d or pain Ext:  No inc edema from baseline   Objective:   Blood pressure (!) 148/90, pulse 78, temperature 98.5 F (36.9 C), height 5\' 4"  (1.626 m), weight 129 lb (58.5 kg), last menstrual period 06/21/2018, SpO2 99 %. Body mass index is 22.14 kg/m. General: Well Developed, well nourished, appropriate for stated age.   Neuro: Alert and oriented x3, extra-ocular muscles intact, sensation grossly intact.  HEENT: Normocephalic, atraumatic, pupils equal round reactive to light, neck supple, no masses, no painful lymphadenopathy, TM's intact B/L, Right TM retracted with positive air-fluid levels, and blunted anatomy. Nares- patent, clear d/c, OP- clear, mild erythema, No TTP sinuses Skin: Warm and dry, no gross rash. Cardiac: RRR, S1 S2,  no murmurs rubs or gallops.  Respiratory: Rhonchorous with inspiratory and expiratory wheezes, most predominantly entire right side, upper and lower lobes, most predominantly right vs left.  Not using accessory muscles, speaking in full sentences- unlabored. Vascular:  No gross lower ext edema, cap RF less 2 sec. Psych: No HI/SI, judgement and insight good, Euthymic mood. Full Affect.

## 2018-07-20 ENCOUNTER — Ambulatory Visit: Payer: BLUE CROSS/BLUE SHIELD | Admitting: Gastroenterology

## 2018-07-29 NOTE — Progress Notes (Signed)
Acute Care Office visit  Assessment and plan:  No diagnosis found.  - Viral vs Allergic vs Bacterial causes for pt's symptoms reveiwed.     - Supportive care and various OTC medications discussed in addition to any prescribed.  - Steroids and antibiotics provided today.  - Per patient, she has taken codeine with tylenol in the past and done fine with this.  Patient denies history of drug abuse and confirms that she can take codeine without complication.  - Advised patient please follow up with her PCP in 3-4 weeks.  Advised patient to ask her PCP to listen to the entire right side of her chest to make sure it clears up.  If it doesn't clear up, recommend obtaining X-ray and/or CT scan in near future, to compare with X-ray obtained in [place] in September.  - Unhappy with the way the entire right side of the lungs sounded; concerns for need for pulmonary function testing, chest X-ray, or CT scan in the near future.  - Strongly encouraged patient to continue goal to quit smoking.  - Call or RTC if new symptoms, or if no improvement or worse over next several days.    No orders of the defined types were placed in this encounter.    There are no discontinued medications.   Gross side effects, risk and benefits, and alternatives of medications discussed with patient.  Patient is aware that all medications have potential side effects and we are unable to predict every sideeffect or drug-drug interaction that may occur.  Expresses verbal understanding and consents to current therapy plan and treatment regiment.   Education and routine counseling performed. Handouts provided.  Anticipatory guidance and routine counseling done re: condition, txmnt options and need for follow up. All questions of patient's were answered.  No follow-ups on file.  Please see AVS handed out to patient at the end of our visit for additional patient instructions/ counseling done pertaining to today's  office visit.  Note:  This document was partially repared using Dragon voice recognition software and may include unintentional dictation errors.  This document serves as a record of services personally performed by Thomasene Lot, DO. It was created on her behalf by Peggye Fothergill, a trained medical scribe. The creation of this record is based on the scribe's personal observations and the provider's statements to them.   I have reviewed the above medical documentation for accuracy and completeness and I concur.  Julaine Fusi, NP 07/29/2018 7:39 AM       Subjective:    No chief complaint on file.   HPI:  Pt presents with Sx for 5-6 days.   C/o:  Started Friday, coughing and congestion, stuffy nose, runny nose.  Symptoms started with a slight cough, worsening.  "More at night when I'm laying down, I just start coughing and coughing and coughing, and then my ears hurt."  Over the past couple of days, she has been experiencing more congestion in her chest, and more coughing.  Feels tightness and pain in her chest when she breathes in.  Feels she is "wheezing again" with her current illness.  As quit smoking for ten days now.  Had an X-ray at the [facility] in September due to suspected pneumonia.  She did not have pneumonia at that time.  Denies:  Patient denies history of drug abuse and notes that she is able to take codeine medications today without complication.    For symptoms patient has tried:  Has used an inhaler for her wheezing in the past.  Has not recently been on inhaler or antibiotics.  Overall getting:  Overall feeling a little better, but worse at night.    Patient Care Team    Relationship Specialty Notifications Start End  [place] Corner[place]ford, Orpha BurKaty D, NP PCP - General Family Medicine  03/22/18     Past medical history, Surgical history, Family history reviewed and noted below, Social history, Allergies, and Medications have been entered into the medical  record, reviewed and changed as needed.   Allergies  Allergen Reactions  . Hydrocodone Itching    Review of Systems: - see above HPI for pertinent positives General:   No F/C, wt loss Pulm:   + coughing, wheezing, pain on inhalation due to wheezing Card:  No CP, palpitations Abd:  No n/v/d or pain Ext:  No inc edema from baseline   Objective:   There were no vitals taken for this visit. There is no height or weight on file to calculate BMI. General: Well Developed, well nourished, appropriate for stated age.  Neuro: Alert and oriented x3, extra-ocular muscles intact, sensation grossly intact.  HEENT: Normocephalic, atraumatic, pupils equal round reactive to light, neck supple, no masses, no painful lymphadenopathy, TM's intact B/L, Right TM retracted with positive air-fluid levels, and blunted anatomy. Nares- patent, clear d/c, OP- clear, mild erythema, No TTP sinuses Skin: Warm and dry, no gross rash. Cardiac: RRR, S1 S2,  no murmurs rubs or gallops.  Respiratory: Rhonchorous with inspiratory and expiratory wheezes, most predominantly entire right side, upper and lower lobes, most predominantly right vs left.  Not using accessory muscles, speaking in full sentences- unlabored. Vascular:  No gross lower ext edema, cap RF less 2 sec. Psych: No HI/SI, judgement and insight good, Euthymic mood. Full Affect.

## 2018-08-02 ENCOUNTER — Ambulatory Visit (INDEPENDENT_AMBULATORY_CARE_PROVIDER_SITE_OTHER): Payer: BLUE CROSS/BLUE SHIELD | Admitting: Adult Health

## 2018-08-02 ENCOUNTER — Ambulatory Visit: Payer: BLUE CROSS/BLUE SHIELD

## 2018-08-02 ENCOUNTER — Encounter: Payer: Self-pay | Admitting: Adult Health

## 2018-08-02 VITALS — BP 135/88 | HR 73 | Temp 98.5°F | Ht 64.0 in | Wt 134.0 lb

## 2018-08-02 DIAGNOSIS — J40 Bronchitis, not specified as acute or chronic: Secondary | ICD-10-CM | POA: Diagnosis not present

## 2018-08-02 DIAGNOSIS — R05 Cough: Secondary | ICD-10-CM

## 2018-08-02 DIAGNOSIS — R053 Chronic cough: Secondary | ICD-10-CM

## 2018-08-02 MED ORDER — HYDROCOD POLST-CPM POLST ER 10-8 MG/5ML PO SUER: 5.0000 mL | Freq: Two times a day (BID) | ORAL | 0 refills | Status: DC | PRN

## 2018-08-02 MED ORDER — AZITHROMYCIN 250 MG PO TABS: ORAL_TABLET | ORAL | 0 refills | Status: DC

## 2018-08-02 MED ORDER — ALBUTEROL SULFATE HFA 108 (90 BASE) MCG/ACT IN AERS: 2.0000 | INHALATION_SPRAY | Freq: Four times a day (QID) | RESPIRATORY_TRACT | 0 refills | Status: DC | PRN

## 2018-08-02 NOTE — Patient Instructions (Signed)
Acute Bronchitis, Adult Acute bronchitis is sudden (acute) swelling of the air tubes (bronchi) in the lungs. Acute bronchitis causes these tubes to fill with mucus, which can make it hard to breathe. It can also cause coughing or wheezing. In adults, acute bronchitis usually goes away within 2 weeks. A cough caused by bronchitis may last up to 3 weeks. Smoking, allergies, and asthma can make the condition worse. Repeated episodes of bronchitis may cause further lung problems, such as chronic obstructive pulmonary disease (COPD). What are the causes? This condition can be caused by germs and by substances that irritate the lungs, including:  Cold and flu viruses. This condition is most often caused by the same virus that causes a cold.  Bacteria.  Exposure to tobacco smoke, dust, fumes, and air pollution.  What increases the risk? This condition is more likely to develop in people who:  Have close contact with someone with acute bronchitis.  Are exposed to lung irritants, such as tobacco smoke, dust, fumes, and vapors.  Have a weak immune system.  Have a respiratory condition such as asthma.  What are the signs or symptoms? Symptoms of this condition include:  A cough.  Coughing up clear, yellow, or green mucus.  Wheezing.  Chest congestion.  Shortness of breath.  A fever.  Body aches.  Chills.  A sore throat.  How is this diagnosed? This condition is usually diagnosed with a physical exam. During the exam, your health care provider may order tests, such as chest X-rays, to rule out other conditions. He or she may also:  Test a sample of your mucus for bacterial infection.  Check the level of oxygen in your blood. This is done to check for pneumonia.  Do a chest X-ray or lung function testing to rule out pneumonia and other conditions.  Perform blood tests.  Your health care provider will also ask about your symptoms and medical history. How is this  treated? Most cases of acute bronchitis clear up over time without treatment. Your health care provider may recommend:  Drinking more fluids. Drinking more makes your mucus thinner, which may make it easier to breathe.  Taking a medicine for a fever or cough.  Taking an antibiotic medicine.  Using an inhaler to help improve shortness of breath and to control a cough.  Using a cool mist vaporizer or humidifier to make it easier to breathe.  Follow these instructions at home: Medicines  Take over-the-counter and prescription medicines only as told by your health care provider.  If you were prescribed an antibiotic, take it as told by your health care provider. Do not stop taking the antibiotic even if you start to feel better. General instructions  Get plenty of rest.  Drink enough fluids to keep your urine clear or pale yellow.  Avoid smoking and secondhand smoke. Exposure to cigarette smoke or irritating chemicals will make bronchitis worse. If you smoke and you need help quitting, ask your health care provider. Quitting smoking will help your lungs heal faster.  Use an inhaler, cool mist vaporizer, or humidifier as told by your health care provider.  Keep all follow-up visits as told by your health care provider. This is important. How is this prevented? To lower your risk of getting this condition again:  Wash your hands often with soap and water. If soap and water are not available, use hand sanitizer.  Avoid contact with people who have cold symptoms.  Try not to touch your hands to your   mouth, nose, or eyes.  Make sure to get the flu shot every year.  Contact a health care provider if:  Your symptoms do not improve in 2 weeks of treatment. Get help right away if:  You cough up blood.  You have chest pain.  You have severe shortness of breath.  You become dehydrated.  You faint or keep feeling like you are going to faint.  You keep vomiting.  You have a  severe headache.  Your fever or chills gets worse. This information is not intended to replace advice given to you by your health care provider. Make sure you discuss any questions you have with your health care provider. Document Released: 09/10/2004 Document Revised: 02/26/2016 Document Reviewed: 01/22/2016 Elsevier Interactive Patient Education  2018 [employer]  Please take Azithromycin as directed. Please use ProAir and Tussionex as needed. Continue to push water and limit soda. Continue to completely avoid all tobacco use- YOU CAN DO IT! If coughing continues after antibiotic completed, please call clinic. Please make follow-up appt to address anxiety. FEEL BETTER!

## 2018-08-02 NOTE — Assessment & Plan Note (Addendum)
CXR- IMPRESSION: No edema or consolidation [place] Controlled Substance Database reviewed- No aberrancies noted Please take Azithromycin as directed. Please use ProAir and Tussionex as needed. Continue to push water and limit soda. Continue to completely avoid all tobacco use- YOU CAN DO IT! If coughing continues after antibiotic completed, please call clinic. Please make follow-up appt to address anxiety.

## 2018-08-02 NOTE — Progress Notes (Addendum)
Subjective:    Patient ID: Sheri Flores, female    DOB: February 06, 1976, 42 y.o.   MRN: [id number]  HPI:  [name] presents with continued productive cough (thick/yellow mucus), thick/yellow nasal drainage, wheezing, night sweats, and chills. She was initially seen with similar sx's 07/13/18- treated with ProAri, Tussionex, Prednisone taper- which she reports taking all as directed.  She reports stopping tobacco 07/03/18. She reports not substituting with vape products She reports that her husband continues to smoke which is why she smells so strongly of tobacco. She reports drinking 3 x 16 oz bottles of water and only "one can of soda" She denies CP/palpitations but does reports dyspnea  Patient Care Team    Relationship Specialty Notifications Start End  Julaine Fusi, NP PCP - General Family Medicine  03/22/18     Patient Active Problem List   Diagnosis Date Noted  . Bronchitis 08/02/2018  . Acute bilateral low back pain without sciatica 06/06/2018  . Healthcare maintenance 05/09/2018  . Loose stools 05/09/2018  . Abnormal urinalysis 05/09/2018     History reviewed. No pertinent past medical history.   Past Surgical History:  Procedure Laterality Date  . TONSILLECTOMY    . TUBAL LIGATION       Family History  Problem Relation Age of Onset  . Breast cancer Paternal Grandmother 38     Social History   Substance and Sexual Activity  Drug Use Not Currently  . Types: Marijuana     Social History   Substance and Sexual Activity  Alcohol Use No  . Frequency: Never     Social History   Tobacco Use  Smoking Status Current Every Day Smoker  . Packs/day: 0.50  . Years: 20.00  . Pack years: 10.00  . Types: Cigarettes  Smokeless Tobacco Never Used     Outpatient Encounter Medications as of 08/02/2018  Medication Sig  . albuterol (PROVENTIL HFA;VENTOLIN HFA) 108 (90 Base) MCG/ACT inhaler Inhale 2 puffs into the lungs every 6 (six) hours as needed for  wheezing or shortness of breath.  . [DISCONTINUED] albuterol (PROVENTIL HFA;VENTOLIN HFA) 108 (90 Base) MCG/ACT inhaler Inhale 2 puffs into the lungs every 6 (six) hours as needed for wheezing or shortness of breath.  . [DISCONTINUED] albuterol (PROVENTIL HFA;VENTOLIN HFA) 108 (90 Base) MCG/ACT inhaler Inhale 2 puffs into the lungs every 6 (six) hours as needed for wheezing or shortness of breath.  Marland Kitchen azithromycin (ZITHROMAX) 250 MG tablet 2 tabs day one. 1 tab days two-five.  . chlorpheniramine-HYDROcodone (TUSSIONEX) 10-8 MG/5ML SUER Take 5 mLs by mouth every 12 (twelve) hours as needed for cough (cough, will cause drowsiness.).  . [DISCONTINUED] chlorpheniramine-HYDROcodone (TUSSIONEX) 10-8 MG/5ML SUER Take 5 mLs by mouth every 12 (twelve) hours as needed for cough (cough, will cause drowsiness.).  . [DISCONTINUED] ondansetron (ZOFRAN-ODT) 4 MG disintegrating tablet Take 1 tablet (4 mg total) by mouth every 8 (eight) hours as needed for nausea or vomiting.  . [DISCONTINUED] predniSONE (DELTASONE) 20 MG tablet Take 3 pills a day for 2 days, 2 pills a day for 2 days, 1 pill a day for 2 days then one half pill a day for 2 days then off   No facility-administered encounter medications on file as of 08/02/2018.     Allergies: Hydrocodone  Body mass index is 23 kg/m.  Blood pressure 135/88, pulse 73, temperature 98.5 F (36.9 C), temperature source Oral, height 5\' 4"  (1.626 m), weight 134 lb (60.8 kg), last menstrual period 07/12/2018, SpO2  100 %.  Review of Systems  Constitutional: Positive for activity change and fatigue. Negative for appetite change, chills, diaphoresis, fever and unexpected weight change.  HENT: Positive for congestion, postnasal drip, rhinorrhea, sinus pressure, sinus pain and voice change. Negative for ear discharge and ear pain.   Eyes: Negative for visual disturbance.  Respiratory: Positive for cough, chest tightness, shortness of breath and wheezing.   Cardiovascular:  Negative for chest pain, palpitations and leg swelling.  Gastrointestinal: Negative for abdominal distention, anal bleeding, blood in stool, diarrhea, nausea and vomiting.  Neurological: Negative for dizziness and headaches.  Psychiatric/Behavioral: Positive for sleep disturbance. Negative for confusion, decreased concentration, dysphoric mood, hallucinations, self-injury and suicidal ideas. The patient is nervous/anxious. The patient is not hyperactive.        Objective:   Physical Exam Constitutional:      Appearance: Normal appearance. She is ill-appearing.     Comments: Reeks of tobacco smoke  HENT:     Head: Normocephalic and atraumatic.     Right Ear: Tympanic membrane is bulging. Tympanic membrane is not erythematous.     Left Ear: Tympanic membrane is bulging. Tympanic membrane is not erythematous.     Nose: Nose normal.     Right Sinus: No maxillary sinus tenderness or frontal sinus tenderness.     Left Sinus: No maxillary sinus tenderness or frontal sinus tenderness.     Mouth/Throat:     Mouth: Mucous membranes are moist.     Dentition: Abnormal dentition. Dental caries present.     Pharynx: Posterior oropharyngeal erythema present. No oropharyngeal exudate.     Tonsils: Swelling: 1+ on the right. 1+ on the left.     Comments: Extreme poor dentition  Neck:     Musculoskeletal: Normal range of motion and neck supple.  Cardiovascular:     Rate and Rhythm: Normal rate.     Pulses: Normal pulses.     Heart sounds: Normal heart sounds. No murmur.  Pulmonary:     Effort: Pulmonary effort is normal.     Breath sounds: Normal breath sounds.  Lymphadenopathy:     Cervical: No cervical adenopathy.  Skin:    General: Skin is warm and dry.     Capillary Refill: Capillary refill takes less than 2 seconds.     Findings: Wound present.     Comments: Scabs in various stages of healing noted on posterior back, bil FAs  Neurological:     Mental Status: She is alert and oriented to  person, place, and time.  Psychiatric:        Attention and Perception: Attention and perception normal.        Mood and Affect: Mood is anxious.        Speech: Speech normal.        Behavior: Behavior normal.        Thought Content: Thought content normal.        Cognition and Memory: Cognition and memory normal.        Judgment: Judgment normal.       Assessment & Plan:   1. Persistent cough   2. Bronchitis     Bronchitis CXR- IMPRESSION: No edema or consolidation [place] Controlled Substance Database reviewed- No aberrancies noted Please take Azithromycin as directed. Please use ProAir and Tussionex as needed. Continue to push water and limit soda. Continue to completely avoid all tobacco use- YOU CAN DO IT! If coughing continues after antibiotic completed, please call clinic. Please make follow-up appt to  address anxiety.    FOLLOW-UP:  Return if symptoms worsen or fail to improve.

## 2018-08-25 NOTE — Progress Notes (Signed)
Subjective:    Patient ID: Sheri Flores, female    DOB: [birth year], 43 y.o.   MRN: [id number]  HPI :  Ms. Sheri Flores presents for anxiety that has been steadily worsening the last 12 months. She denies sig life change 1 year ago She reports her son was in serious MVC 8 weeks ago- hospitalized >2 weeks, now home and recovering well. She states "I seem to over worry about silly things and my mind races at night". She reports sleeping as little as two hrs per night some evenings. She goes to bed at 2000, rises 0400 She denies napping during day She walks >1 hr 2-3 times/week She eats a diet high in saturated fat- discussed following a Mediterranean style of eating She remains off tobacco products since 07/03/18 She reports being on Alprazolam PRM early 2000s She has never been on SSRI/SNRI She declines Behavioral Health referral, re: transportation is an issue- only one car between her and her husband She would like to get a job once her upper dentures are completed Chronic hematochezia- she recently cancelled GI appt, advised to f/u with specialist ASAP  Patient Care Team    Relationship Specialty Notifications Start End  CourtlandDanford, Jinny BlossomKaty D, NP PCP - General Family Medicine  03/22/18     Patient Active Problem List   Diagnosis Date Noted  . GAD (generalized anxiety disorder) 08/29/2018  . 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 Hematochezia 08/29/2018  . Bronchitis 08/02/2018  . Acute bilateral low back pain without sciatica 06/06/2018  . Healthcare maintenance 05/09/2018  . Loose stools 05/09/2018  . Abnormal urinalysis 05/09/2018     History reviewed. No pertinent past medical history.   Past Surgical History:  Procedure Laterality Date  . TONSILLECTOMY    . TUBAL LIGATION       Family History  Problem Relation Age of Onset  . Breast cancer Paternal Grandmother [id number]     Social History   Substance and Sexual Activity  Drug Use Not Currently  . Types:  Marijuana     Social History   Substance and Sexual Activity  Alcohol Use No  . Frequency: Never     Social History   Tobacco Use  Smoking Status Current Every Day Smoker  . Packs/day: 0.50  . Years: 20.00  . Pack years: 10.00  . Types: Cigarettes  Smokeless Tobacco Never Used     Outpatient Encounter Medications as of 08/29/2018  Medication Sig  . albuterol (PROVENTIL HFA;VENTOLIN HFA) 108 (90 Base) MCG/ACT inhaler Inhale 2 puffs into the lungs every 6 (six) hours as needed for wheezing or shortness of breath.  Marland Kitchen. FLUoxetine (PROZAC) 20 MG tablet 1/2 tablet for one week then increase to one full tablet daily  . traZODone (DESYREL) 50 MG tablet Take 0.5-1 tablets (25-50 mg total) by mouth at bedtime as needed for sleep.  . [DISCONTINUED] azithromycin (ZITHROMAX) 250 MG tablet 2 tabs day one. 1 tab days two-five.  . [DISCONTINUED] chlorpheniramine-HYDROcodone (TUSSIONEX) 10-8 MG/5ML SUER Take 5 mLs by mouth every 12 (twelve) hours as needed for cough (cough, will cause drowsiness.).   No facility-administered encounter medications on file as of 08/29/2018.     Allergies: Hydrocodone  Body mass index is 23.07 kg/m.  Blood pressure 120/77, pulse 85, temperature 98.4 F (36.9 C), temperature source Oral, height 5\' 4"  (1.626 m), weight 134 lb 6.4 oz (61 kg), last menstrual period 08/15/2018, SpO2 98 %.  Review of Systems  Constitutional: Positive for fatigue.  Negative for activity change, appetite change, chills, diaphoresis, fever and unexpected weight change.  HENT: Positive for dental problem.   Eyes: Negative for visual disturbance.  Respiratory: Positive for cough. Negative for chest tightness, shortness of breath, wheezing and stridor.   Cardiovascular: Negative for chest pain, palpitations and leg swelling.  Gastrointestinal: Positive for blood in stool. Negative for abdominal distention, abdominal pain, constipation, diarrhea, nausea and vomiting.  Endocrine:  Negative for cold intolerance, heat intolerance, polydipsia, polyphagia and polyuria.  Genitourinary: Negative for difficulty urinating and flank pain.  Neurological: Negative for dizziness and headaches.  Hematological: Does not bruise/bleed easily.  Psychiatric/Behavioral: Positive for dysphoric mood and sleep disturbance. Negative for behavioral problems, confusion, decreased concentration, hallucinations, self-injury and suicidal ideas. The patient is nervous/anxious. The patient is not hyperactive.        Objective:   Physical Exam Vitals signs and nursing note reviewed.  Constitutional:      General: She is not in acute distress.    Appearance: Normal appearance. She is obese. She is not ill-appearing, toxic-appearing or diaphoretic.  HENT:     Right Ear: No decreased hearing noted.     Left Ear: No decreased hearing noted.     Mouth/Throat:     Dentition: Abnormal dentition. Dental caries present.  Eyes:     Extraocular Movements: Extraocular movements intact.     Conjunctiva/sclera: Conjunctivae normal.     Pupils: Pupils are equal, round, and reactive to light.  Cardiovascular:     Rate and Rhythm: Normal rate.     Pulses: Normal pulses.     Heart sounds: Normal heart sounds. No murmur. No friction rub. No gallop.   Pulmonary:     Effort: Pulmonary effort is normal. No respiratory distress.     Breath sounds: No stridor. No wheezing, rhonchi or rales.  Chest:     Chest wall: No tenderness.  Skin:    Capillary Refill: Capillary refill takes less than 2 seconds.  Neurological:     Mental Status: She is alert and oriented to person, place, and time.  Psychiatric:        Mood and Affect: Mood normal.        Behavior: Behavior normal.        Thought Content: Thought content normal.        Judgment: Judgment normal.       Assessment & Plan:   1. GAD (generalized anxiety disorder)   2. Insomnia due to other mental disorder   3. Hematochezia     GAD (generalized  anxiety disorder) Please start Fluoxetine 20mg - 1/2 tablet daily for one week then increase to one tablet daily. Please use Trazodone 50mg - 1/2 to 1 tablet as needed at night. Increase water intake, strive for at least 75 ounces/day.   Follow Mediterranean diet Increase regular exercise.  Recommend at least 30 minutes daily, 5 days per week of walking, jogging, biking, swimming, YouTube/Pinterest workout videos. Continue to abstain from tobacco use. Please call Gastroenterology to make appt ASAP. Follow-up here in 3-4 weeks, sooner if needed.   Insomnia due to other mental disorder [place] Controlled Substance Database reviewed- no aberrancies noted Practice good Sleep Hygiene  Please use Trazodone 50mg - 1/2 to 1 tablet as needed at night. Increase water intake, strive for at least 75 ounces/day.   Follow Mediterranean diet Increase regular exercise.  Recommend at least 30 minutes daily, 5 days per week of walking, jogging, biking, swimming,  Hematochezia F/u with GI Pt communicated understanding/agreement  Pt was in the office today for 25+ minutes, I spent >75%of time in face to face counseling of patient's various medical conditions and in coordination of care  FOLLOW-UP:  Return in about 4 weeks (around 09/26/2018) for General Anxiety Disorder, Evaluate Medication Effectiveness, Insomnia.

## 2018-08-29 ENCOUNTER — Ambulatory Visit (INDEPENDENT_AMBULATORY_CARE_PROVIDER_SITE_OTHER): Payer: BLUE CROSS/BLUE SHIELD | Admitting: Adult Health

## 2018-08-29 ENCOUNTER — Encounter: Payer: Self-pay | Admitting: Adult Health

## 2018-08-29 VITALS — BP 120/77 | HR 85 | Temp 98.4°F | Ht 64.0 in | Wt 134.4 lb

## 2018-08-29 DIAGNOSIS — F99 Mental disorder, not otherwise specified: Secondary | ICD-10-CM | POA: Diagnosis not present

## 2018-08-29 DIAGNOSIS — F410 Panic disorder [episodic paroxysmal anxiety] without agoraphobia: Secondary | ICD-10-CM | POA: Insufficient documentation

## 2018-08-29 DIAGNOSIS — K921 Melena: Secondary | ICD-10-CM

## 2018-08-29 DIAGNOSIS — F411 Generalized anxiety disorder: Secondary | ICD-10-CM

## 2018-08-29 DIAGNOSIS — F5105 Insomnia due to other mental disorder: Secondary | ICD-10-CM

## 2018-08-29 DIAGNOSIS — G4700 Insomnia, unspecified: Secondary | ICD-10-CM | POA: Insufficient documentation

## 2018-08-29 MED ORDER — FLUOXETINE HCL 20 MG PO TABS: ORAL_TABLET | ORAL | 1 refills | Status: DC

## 2018-08-29 MED ORDER — TRAZODONE HCL 50 MG PO TABS: 25.0000 mg | ORAL_TABLET | Freq: Every evening | ORAL | 1 refills | Status: DC | PRN

## 2018-08-29 NOTE — Assessment & Plan Note (Signed)
[place] Controlled Substance Database reviewed- no aberrancies noted Practice good Sleep Hygiene  Please use Trazodone 50mg - 1/2 to 1 tablet as needed at night. Increase water intake, strive for at least 75 ounces/day.   Follow Mediterranean diet Increase regular exercise.  Recommend at least 30 minutes daily, 5 days per week of walking, jogging, biking, swimming,

## 2018-08-29 NOTE — Assessment & Plan Note (Signed)
Please start Fluoxetine 20mg - 1/2 tablet daily for one week then increase to one tablet daily. Please use Trazodone 50mg - 1/2 to 1 tablet as needed at night. Increase water intake, strive for at least 75 ounces/day.   Follow Mediterranean diet Increase regular exercise.  Recommend at least 30 minutes daily, 5 days per week of walking, jogging, biking, swimming, YouTube/Pinterest workout videos. Continue to abstain from tobacco use. Please call Gastroenterology to make appt ASAP. Follow-up here in 3-4 weeks, sooner if needed.

## 2018-08-29 NOTE — Patient Instructions (Addendum)
Generalized Anxiety Disorder, Adult Generalized anxiety disorder (GAD) is a mental health disorder. People with this condition constantly worry about everyday events. Unlike normal anxiety, worry related to GAD is not triggered by a specific event. These worries also do not fade or get better with time. GAD interferes with life functions, including relationships, work, and school. GAD can vary from mild to severe. People with severe GAD can have intense waves of anxiety with physical symptoms (panic attacks). What are the causes? The exact cause of GAD is not known. What increases the risk? This condition is more likely to develop in:  Women.  People who have a family history of anxiety disorders.  People who are very shy.  People who experience very stressful life events, such as the death of a loved one.  People who have a very stressful family environment. What are the signs or symptoms? People with GAD often worry excessively about many things in their lives, such as their health and family. They may also be overly concerned about:  Doing well at work.  Being on time.  Natural disasters.  Friendships. Physical symptoms of GAD include:  Fatigue.  Muscle tension or having muscle twitches.  Trembling or feeling shaky.  Being easily startled.  Feeling like your heart is pounding or racing.  Feeling out of breath or like you cannot take a deep breath.  Having trouble falling asleep or staying asleep.  Sweating.  Nausea, diarrhea, or irritable bowel syndrome (IBS).  Headaches.  Trouble concentrating or remembering facts.  Restlessness.  Irritability. How is this diagnosed? Your health care provider can diagnose GAD based on your symptoms and medical history. You will also have a physical exam. The health care provider will ask specific questions about your symptoms, including how severe they are, when they started, and if they come and go. Your health care  provider may ask you about your use of alcohol or drugs, including prescription medicines. Your health care provider may refer you to a mental health specialist for further evaluation. Your health care provider will do a thorough examination and may perform additional tests to rule out other possible causes of your symptoms. To be diagnosed with GAD, a person must have anxiety that:  Is out of his or her control.  Affects several different aspects of his or her life, such as work and relationships.  Causes distress that makes him or her unable to take part in normal activities.  Includes at least three physical symptoms of GAD, such as restlessness, fatigue, trouble concentrating, irritability, muscle tension, or sleep problems. Before your health care provider can confirm a diagnosis of GAD, these symptoms must be present more days than they are not, and they must last for six months or longer. How is this treated? The following therapies are usually used to treat GAD:  Medicine. Antidepressant medicine is usually prescribed for long-term daily control. Antianxiety medicines may be added in severe cases, especially when panic attacks occur.  Talk therapy (psychotherapy). Certain types of talk therapy can be helpful in treating GAD by providing support, education, and guidance. Options include: ? Cognitive behavioral therapy (CBT). People learn coping skills and techniques to ease their anxiety. They learn to identify unrealistic or negative thoughts and behaviors and to replace them with positive ones. ? Acceptance and commitment therapy (ACT). This treatment teaches people how to be mindful as a way to cope with unwanted thoughts and feelings. ? Biofeedback. This process trains you to manage your body's response (  physiological response) through breathing techniques and relaxation methods. You will work with a therapist while machines are used to monitor your physical symptoms.  Stress  management techniques. These include yoga, meditation, and exercise. A mental health specialist can help determine which treatment is best for you. Some people see improvement with one type of therapy. However, other people require a combination of therapies. Follow these instructions at home:  Take over-the-counter and prescription medicines only as told by your health care provider.  Try to maintain a normal routine.  Try to anticipate stressful situations and allow extra time to manage them.  Practice any stress management or self-calming techniques as taught by your health care provider.  Do not punish yourself for setbacks or for not making progress.  Try to recognize your accomplishments, even if they are small.  Keep all follow-up visits as told by your health care provider. This is important. Contact a health care provider if:  Your symptoms do not get better.  Your symptoms get worse.  You have signs of depression, such as: ? A persistently sad, cranky, or irritable mood. ? Loss of enjoyment in activities that used to bring you joy. ? Change in weight or eating. ? Changes in sleeping habits. ? Avoiding friends or family members. ? Loss of energy for normal tasks. ? Feelings of guilt or worthlessness. Get help right away if:  You have serious thoughts about hurting yourself or others. If you ever feel like you may hurt yourself or others, or have thoughts about taking your own life, get help right away. You can go to your nearest emergency department or call:  Your local emergency services (911 in the [place]  A suicide crisis helpline, such as the National Suicide Prevention Lifeline at 514-264-1134. This is open 24 hours a day. Summary  Generalized anxiety disorder (GAD) is a mental health disorder that involves worry that is not triggered by a specific event.  People with GAD often worry excessively about many things in their lives, such as their health and  family.  GAD may cause physical symptoms such as restlessness, trouble concentrating, sleep problems, frequent sweating, nausea, diarrhea, headaches, and trembling or muscle twitching.  A mental health specialist can help determine which treatment is best for you. Some people see improvement with one type of therapy. However, other people require a combination of therapies. This information is not intended to replace advice given to you by your health care provider. Make sure you discuss any questions you have with your health care provider. Document Released: 11/28/2012 Document Revised: 06/23/2016 Document Reviewed: 06/23/2016 Elsevier Interactive Patient Education  2019 [employer]    Insomnia Insomnia is a sleep disorder that makes it difficult to fall asleep or stay asleep. Insomnia can cause fatigue, low energy, difficulty concentrating, mood swings, and poor performance at work or school. There are three different ways to classify insomnia:  Difficulty falling asleep.  Difficulty staying asleep.  Waking up too early in the morning. Any type of insomnia can be long-term (chronic) or short-term (acute). Both are common. Short-term insomnia usually lasts for three months or less. Chronic insomnia occurs at least three times a week for longer than three months. What are the causes? Insomnia may be caused by another condition, situation, or substance, such as:  Anxiety.  Certain medicines.  Gastroesophageal reflux disease (GERD) or other gastrointestinal conditions.  Asthma or other breathing conditions.  Restless legs syndrome, sleep apnea, or other sleep disorders.  Chronic pain.  Menopause.  Stroke.  Abuse of alcohol, tobacco, or illegal drugs.  Mental health conditions, such as depression.  Caffeine.  Neurological disorders, such as Alzheimer's disease.  An overactive thyroid (hyperthyroidism). Sometimes, the cause of insomnia may not be known. What  increases the risk? Risk factors for insomnia include:  Gender. Women are affected more often than men.  Age. Insomnia is more common as you get older.  Stress.  Lack of exercise.  Irregular work schedule or working night shifts.  Traveling between different time zones.  Certain medical and mental health conditions. What are the signs or symptoms? If you have insomnia, the main symptom is having trouble falling asleep or having trouble staying asleep. This may lead to other symptoms, such as:  Feeling fatigued or having low energy.  Feeling nervous about going to sleep.  Not feeling rested in the morning.  Having trouble concentrating.  Feeling irritable, anxious, or depressed. How is this diagnosed? This condition may be diagnosed based on:  Your symptoms and medical history. Your health care provider may ask about: ? Your sleep habits. ? Any medical conditions you have. ? Your mental health.  A physical exam. How is this treated? Treatment for insomnia depends on the cause. Treatment may focus on treating an underlying condition that is causing insomnia. Treatment may also include:  Medicines to help you sleep.  Counseling or therapy.  Lifestyle adjustments to help you sleep better. Follow these instructions at home: Eating and drinking   Limit or avoid alcohol, caffeinated beverages, and cigarettes, especially close to bedtime. These can disrupt your sleep.  Do not eat a large meal or eat spicy foods right before bedtime. This can lead to digestive discomfort that can make it hard for you to sleep. Sleep habits   Keep a sleep diary to help you and your health care provider figure out what could be causing your insomnia. Write down: ? When you sleep. ? When you wake up during the night. ? How well you sleep. ? How rested you feel the next day. ? Any side effects of medicines you are taking. ? What you eat and drink.  Make your bedroom a dark,  comfortable place where it is easy to fall asleep. ? Put up shades or blackout curtains to block light from outside. ? Use a white noise machine to block noise. ? Keep the temperature cool.  Limit screen use before bedtime. This includes: ? Watching TV. ? Using your smartphone, tablet, or computer.  Stick to a routine that includes going to bed and waking up at the same times every day and night. This can help you fall asleep faster. Consider making a quiet activity, such as reading, part of your nighttime routine.  Try to avoid taking naps during the day so that you sleep better at night.  Get out of bed if you are still awake after 15 minutes of trying to sleep. Keep the lights down, but try reading or doing a quiet activity. When you feel sleepy, go back to bed. General instructions  Take over-the-counter and prescription medicines only as told by your health care provider.  Exercise regularly, as told by your health care provider. Avoid exercise starting several hours before bedtime.  Use relaxation techniques to manage stress. Ask your health care provider to suggest some techniques that may work well for you. These may include: ? Breathing exercises. ? Routines to release muscle tension. ? Visualizing peaceful scenes.  Make sure that you drive carefully. Avoid driving  if you feel very sleepy.  Keep all follow-up visits as told by your health care provider. This is important. Contact a health care provider if:  You are tired throughout the day.  You have trouble in your daily routine due to sleepiness.  You continue to have sleep problems, or your sleep problems get worse. Get help right away if:  You have serious thoughts about hurting yourself or someone else. If you ever feel like you may hurt yourself or others, or have thoughts about taking your own life, get help right away. You can go to your nearest emergency department or call:  Your local emergency services (911  in the [place]  A suicide crisis helpline, such as the National Suicide Prevention Lifeline at 308-752-3951. This is open 24 hours a day. Summary  Insomnia is a sleep disorder that makes it difficult to fall asleep or stay asleep.  Insomnia can be long-term (chronic) or short-term (acute).  Treatment for insomnia depends on the cause. Treatment may focus on treating an underlying condition that is causing insomnia.  Keep a sleep diary to help you and your health care provider figure out what could be causing your insomnia. This information is not intended to replace advice given to you by your health care provider. Make sure you discuss any questions you have with your health care provider. Document Released: 07/31/2000 Document Revised: 05/13/2017 Document Reviewed: 05/13/2017 Elsevier Interactive Patient Education  2019 [employer]  Please start Fluoxetine 20mg - 1/2 tablet daily for one week then increase to one tablet daily. Please use Trazodone 50mg - 1/2 to 1 tablet as needed at night. Increase water intake, strive for at least 75 ounces/day.   Follow Mediterranean diet Increase regular exercise.  Recommend at least 30 minutes daily, 5 days per week of walking, jogging, biking, swimming, YouTube/Pinterest workout videos. Continue to abstain from tobacco use. Please call Gastroenterology to make appt ASAP. Follow-up here in 3-4 weeks, sooner if needed.

## 2018-08-29 NOTE — Assessment & Plan Note (Signed)
F/u with GI Pt communicated understanding/agreement

## 2018-09-15 NOTE — Progress Notes (Signed)
[facility] Consult Note:  History: Sheri Flores 09/16/2018  Referring physician: Esaw Grandchild, NP  Reason for consult/chief complaint: Abdominal Pain (generalized. Worse with eating); Gastroesophageal Reflux (flares few times per week); and Hemorrhoids (flares on and off)   Subjective  HPI:  This is a very pleasant 43 year old woman referred by primary care after recent office visit.  She describes about a year of generalized abdominal pain that is typically worse in the upper abdomen and more so after meals.  Her bowel habits are regular.  She has intermittent rectal bleeding that she has attributed to hemorrhoids, reportedly found on a colonoscopy 20 years ago after pregnancy.  She has intermittent regurgitation and pyrosis but no dysphagia.  She has nausea but no vomiting.  Sheri Flores has also been suffering from a severe anxiety disorder over the last year, and just started an SSRI. She had been referred to [place] previously but canceled that appointment. Sheri Flores does not think the rectal bleeding has changed in frequency or character over the last year, but she has been having more severe menstrual pain, particularly in the first couple of days.  The generalized abdominal pain occurs nearly every day, so not necessarily during her menstrual cycle. She was also referred to gynecology for questionable abnormal finding on cervical exam.  Dr. Donalynn Furlong examined her on 06/27/2018, exam including colposcopy was normal.  No abdominal or pelvic imaging done.  ROS:  Review of Systems  Constitutional: Negative for appetite change and unexpected weight change.  HENT: Negative for mouth sores and voice change.   Eyes: Negative for pain and redness.  Respiratory: Negative for cough and shortness of breath.   Cardiovascular: Negative for chest pain and palpitations.  Genitourinary: Negative for dysuria and hematuria.  Musculoskeletal: Negative for arthralgias and  myalgias.  Skin: Negative for pallor and rash.  Neurological: Negative for weakness and headaches.  Hematological: Negative for adenopathy.  Psychiatric/Behavioral:       Anxiety   Past Medical History: Past Medical History:  Diagnosis Date  . Anxiety   . Hemorrhoids      Past Surgical History: Past Surgical History:  Procedure Laterality Date  . TONSILLECTOMY    . TUBAL LIGATION       Family History: Family History  Problem Relation Age of Onset  . Breast cancer Paternal Grandmother 62  . Breast cancer Maternal Grandmother   . Colon cancer Paternal Grandfather     Social History: Social History   Socioeconomic History  . Marital status: Married    Spouse name: Not on file  . Number of children: 3  . Years of education: Not on file  . Highest education level: Not on file  Occupational History  . Occupation: stay at home  Social Needs  . Financial resource strain: Not on file  . Food insecurity:    Worry: Not on file    Inability: Not on file  . Transportation needs:    Medical: Not on file    Non-medical: Not on file  Tobacco Use  . Smoking status: Current Every Day Smoker    Packs/day: 0.50    Years: 20.00    Pack years: 10.00    Types: Cigarettes  . Smokeless tobacco: Never Used  Substance and Sexual Activity  . Alcohol use: No    Frequency: Never  . Drug use: Not Currently    Types: Marijuana  . Sexual activity: Yes    Birth control/protection: Surgical    Comment: 1st  intercourse 60 yo-5 partners  Lifestyle  . Physical activity:    Days per week: Not on file    Minutes per session: Not on file  . Stress: Not on file  Relationships  . Social connections:    Talks on phone: Not on file    Gets together: Not on file    Attends religious service: Not on file    Active member of club or organization: Not on file    Attends meetings of clubs or organizations: Not on file    Relationship status: Not on file  Other Topics Concern  . Not on  file  Social History Narrative  . Not on file   She reports having quit smoking and decreasing caffeine intake. She denies abuse. Used marijuana many years ago.  No other drug use at any point.  Allergies: Allergies  Allergen Reactions  . Hydrocodone Itching    Outpatient Meds: Current Outpatient Medications  Medication Sig Dispense Refill  . albuterol (PROVENTIL HFA;VENTOLIN HFA) 108 (90 Base) MCG/ACT inhaler Inhale 2 puffs into the lungs every 6 (six) hours as needed for wheezing or shortness of breath. 1 Inhaler 0  . FLUoxetine (PROZAC) 20 MG tablet 1/2 tablet for one week then increase to one full tablet daily 30 tablet 1  . traZODone (DESYREL) 50 MG tablet Take 0.5-1 tablets (25-50 mg total) by mouth at bedtime as needed for sleep. 30 tablet 1  . Na Sulfate-K Sulfate-Mg Sulf 17.5-3.13-1.6 GM/177ML SOLN Take 1 kit by mouth once for 1 dose. 354 mL 0  . omeprazole (PRILOSEC) 40 MG capsule Take 1 capsule (40 mg total) by mouth daily. 30 capsule 1   No current facility-administered medications for this visit.       ___________________________________________________________________ Objective   Exam:  BP 122/80   Pulse 84   Ht '5\' 4"'$  (1.626 m)   Wt 130 lb 6 oz (59.1 kg)   BMI 22.38 kg/m    General: this is a(n) pleasant and anxious appearing woman who looks older than stated age.  She is somewhat disheveled with dirt under the fingernails.  Thin with poor muscle mass  Eyes: sclera anicteric, no redness  ENT: oral mucosa moist without lesions, no cervical or supraclavicular lymphadenopathy.  Very poor dentition, no loose teeth.    CV: RRR without murmur, S1/S2, no JVD, no peripheral edema  Resp: clear to auscultation bilaterally, normal RR and effort noted  GI: soft, bandlike upper tenderness, with active bowel sounds. No guarding or palpable organomegaly noted.  Skin; warm and dry, no rash or jaundice noted  Neuro: awake, alert and oriented x 3. Normal gross motor  function and fluent speech  Labs:  CBC Latest Ref Rng & Units 05/09/2018 09/29/2017 10/15/2011  WBC 3.4 - 10.8 x10E3/uL 4.3 12.1(H) 8.0  Hemoglobin 11.1 - 15.9 g/dL 14.9 15.4(H) 15.2  Hematocrit 34.0 - 46.6 % 44.2 45.1 45.0  Platelets 150 - 450 x10E3/uL 315 347 268   Normal TSH 04/2018  Radiologic Studies:  No abdominal or pelvic imaging  Assessment: Encounter Diagnoses  Name Primary?  . Generalized abdominal pain Yes  . Rectal bleeding   . Nausea without vomiting   . Abnormal loss of weight     Symptoms somewhat difficult to characterize, diffuse abdominal pain.  It sounds like it is been steadily worsening, and I am concerned about the weight loss.  While she seems to have been suffering from a severe anxiety disorder, we must rule out ulcer, sprue, malignancy.  She has worsening menstrual symptoms, consider endometriosis Rectal bleeding may be hemorrhoids as described, but must consider neoplasia given the abdominal pain and weight loss.  She does not have chronic diarrhea to suggest colitis.  Plan:  EGD and colonoscopy.  She is agreeable after discussion of procedure and risks.  The benefits and risks of the planned procedure were described in detail with the patient or (when appropriate) their health care proxy.  Risks were outlined as including, but not limited to, bleeding, infection, perforation, adverse medication reaction leading to cardiac or pulmonary decompensation, or pancreatitis (if ERCP).  The limitation of incomplete mucosal visualization was also discussed.  No guarantees or warranties were given.  Omeprazole 40 mg daily  Thank you for the courtesy of this consult.  Please call me with any questions or concerns.  Nelida Meuse III  CC: Referring provider noted above

## 2018-09-15 NOTE — Progress Notes (Signed)
Subjective:    Patient ID: Sheri Flores, female    DOB: [birth year], 43 y.o.   MRN: [id number]  HPI : 08/29/2018 OV: Ms. Harralson presents for anxiety that has been steadily worsening the last 12 months. She denies sig life change 1 year ago She reports her son was in serious MVC 8 weeks ago- hospitalized >2 weeks, now home and recovering well. She states "I seem to over worry about silly things and my mind races at night". She reports sleeping as little as two hrs per night some evenings. She goes to bed at 2000, rises 0400 She denies napping during day She walks >1 hr 2-3 times/week She eats a diet high in saturated fat- discussed following a Mediterranean style of eating She remains off tobacco products since 07/03/18 She reports being on Alprazolam PRM early 2000s She has never been on SSRI/SNRI She declines Behavioral Health referral, re: transportation is an issue- only one car between her and her husband She would like to get a job once her upper dentures are completed Chronic hematochezia- she recently cancelled GI appt, advised to f/u with specialist ASAP  09/26/2018 OV: Ms. Tofte presents for f/u: GAD She was started on Fluoxetine and has titrated up to 20mg  QD- she reports "feeling just so much better!" She reports "even my husband says I seem so much more even keeled". She denies thoughts of harming herself/others She continues to walk her dogs >28miles/day 4 times per week She estimates to drink >50 oz water/day and remains off tobacco- GREAT! Despite Trazodone - she continues to experience multiple early waking She reports taking Trazodone 50mg  1/2 to 1 tab QHS She is still trying to get scheduled with Dentist for dentures She has EGD/Colonoscopy scheduled 10 Oct 2018  Patient Care Team    Relationship Specialty Notifications Start End  Julaine Fusi, NP PCP - General Family Medicine  03/22/18     Patient Active Problem List   Diagnosis Date Noted  . GAD (generalized  anxiety disorder) 08/29/2018  . 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 Hematochezia 08/29/2018  . Bronchitis 08/02/2018  . Acute bilateral low back pain without sciatica 06/06/2018  . Healthcare maintenance 05/09/2018  . Loose stools 05/09/2018  . Abnormal urinalysis 05/09/2018     Past Medical History:  Diagnosis Date  . Anxiety   . Hemorrhoids      Past Surgical History:  Procedure Laterality Date  . TONSILLECTOMY    . TUBAL LIGATION       Family History  Problem Relation Age of Onset  . Breast cancer Paternal Grandmother 4  . Breast cancer Maternal Grandmother   . Colon cancer Paternal Grandfather      Social History   Substance and Sexual Activity  Drug Use Not Currently  . Types: Marijuana     Social History   Substance and Sexual Activity  Alcohol Use No  . Frequency: Never     Social History   Tobacco Use  Smoking Status Current Every Day Smoker  . Packs/day: 0.50  . Years: 20.00  . Pack years: 10.00  . Types: Cigarettes  Smokeless Tobacco Never Used     Outpatient Encounter Medications as of 09/26/2018  Medication Sig  . albuterol (PROVENTIL HFA;VENTOLIN HFA) 108 (90 Base) MCG/ACT inhaler Inhale 2 puffs into the lungs every 6 (six) hours as needed for wheezing or shortness of breath.  Marland Kitchen FLUoxetine (PROZAC) 20 MG tablet 1/2 tablet for one week then increase to  one full tablet daily  . omeprazole (PRILOSEC) 40 MG capsule Take 1 capsule (40 mg total) by mouth daily.  . traZODone (DESYREL) 100 MG tablet 1 tablet by mouth as needed at bedtime for insomnia  . [DISCONTINUED] FLUoxetine (PROZAC) 20 MG tablet 1/2 tablet for one week then increase to one full tablet daily  . [DISCONTINUED] traZODone (DESYREL) 50 MG tablet Take 0.5-1 tablets (25-50 mg total) by mouth at bedtime as needed for sleep.   No facility-administered encounter medications on file as of 09/26/2018.     Allergies: Hydrocodone  Body mass index is 23.14  kg/m.  Blood pressure 107/71, pulse 70, temperature 98.7 F (37.1 C), temperature source Oral, height 5\' 4"  (1.626 m), weight 134 lb 12.8 oz (61.1 kg), last menstrual period 09/11/2018, SpO2 100 %.  Review of Systems  Constitutional: Positive for fatigue. Negative for activity change, appetite change, chills, diaphoresis, fever and unexpected weight change.  HENT: Positive for dental problem.   Eyes: Negative for visual disturbance.  Respiratory: Negative for cough, chest tightness, shortness of breath, wheezing and stridor.   Cardiovascular: Negative for chest pain, palpitations and leg swelling.  Gastrointestinal: Positive for blood in stool. Negative for abdominal distention, abdominal pain, constipation, diarrhea, nausea and vomiting.  Endocrine: Negative for cold intolerance, heat intolerance, polydipsia, polyphagia and polyuria.  Genitourinary: Negative for difficulty urinating and flank pain.  Neurological: Negative for dizziness and headaches.  Hematological: Does not bruise/bleed easily.  Psychiatric/Behavioral: Positive for sleep disturbance. Negative for behavioral problems, confusion, decreased concentration, dysphoric mood, hallucinations, self-injury and suicidal ideas. The patient is not nervous/anxious and is not hyperactive.        Objective:   Physical Exam Vitals signs and nursing note reviewed.  Constitutional:      General: She is not in acute distress.    Appearance: Normal appearance. She is obese. She is not ill-appearing, toxic-appearing or diaphoretic.  HENT:     Right Ear: No decreased hearing noted.     Left Ear: No decreased hearing noted.     Mouth/Throat:     Dentition: Abnormal dentition. Dental caries present.  Eyes:     Extraocular Movements: Extraocular movements intact.     Conjunctiva/sclera: Conjunctivae normal.     Pupils: Pupils are equal, round, and reactive to light.  Cardiovascular:     Rate and Rhythm: Normal rate.     Pulses: Normal  pulses.     Heart sounds: Normal heart sounds. No murmur. No friction rub. No gallop.   Pulmonary:     Effort: Pulmonary effort is normal. No respiratory distress.     Breath sounds: No stridor. No wheezing, rhonchi or rales.  Chest:     Chest wall: No tenderness.  Skin:    Capillary Refill: Capillary refill takes less than 2 seconds.  Neurological:     Mental Status: She is alert and oriented to person, place, and time.  Psychiatric:        Mood and Affect: Mood normal.        Behavior: Behavior normal.        Thought Content: Thought content normal.        Judgment: Judgment normal.       Assessment & Plan:   1. Insomnia due to other mental disorder   2. Acute bilateral low back pain without sciatica   3. GAD (generalized anxiety disorder)   4. Hematochezia   5. Healthcare maintenance     GAD (generalized anxiety disorder) Continue Fluoxetine 20mg  once  daily and regular exercise. Increase Trazodone to 100mg  once nightly as needed for insomnia, again continue regular exercise. Continue to abstain from tobacco- GREAT JOB! Increase plain water intake, strive for at least 70 oz/day. Good luck on 24 Feb- EGD/Colonoscopy. Follow-up 8 months for complete physical, please schedule fasting labs the week prior.  Insomnia due to other mental disorder Continue Fluoxetine 20mg  once daily and regular exercise. Increase Trazodone to 100mg  once nightly as needed for insomnia, again continue regular exercise. Continue to abstain from tobacco- GREAT JOB! Increase plain water intake, strive for at least 70 oz/day. Good luck on 24 Feb- EGD/Colonoscopy. Follow-up 8 months for complete physical, please schedule fasting labs the week prior.  Hematochezia Good luck on 24 Feb- EGD/Colonoscopy.   Healthcare maintenance Follow-up 8 months for complete physical, please schedule fasting labs the week prior.   FOLLOW-UP:  Return in about 8 months (around 05/27/2019) for CPE, Fasting Labs.

## 2018-09-16 ENCOUNTER — Ambulatory Visit: Payer: BLUE CROSS/BLUE SHIELD | Admitting: Gastroenterology

## 2018-09-16 ENCOUNTER — Encounter: Payer: Self-pay | Admitting: Gastroenterology

## 2018-09-16 VITALS — BP 122/80 | HR 84 | Ht 64.0 in | Wt 130.4 lb

## 2018-09-16 DIAGNOSIS — K625 Hemorrhage of anus and rectum: Secondary | ICD-10-CM

## 2018-09-16 DIAGNOSIS — R110 Nausea: Secondary | ICD-10-CM

## 2018-09-16 DIAGNOSIS — R1084 Generalized abdominal pain: Secondary | ICD-10-CM

## 2018-09-16 DIAGNOSIS — R634 Abnormal weight loss: Secondary | ICD-10-CM

## 2018-09-16 MED ORDER — OMEPRAZOLE 40 MG PO CPDR: 40.0000 mg | DELAYED_RELEASE_CAPSULE | Freq: Every day | ORAL | 1 refills | Status: DC

## 2018-09-16 MED ORDER — NA SULFATE-K SULFATE-MG SULF 17.5-3.13-1.6 GM/177ML PO SOLN: 1.0000 | Freq: Once | ORAL | 0 refills | Status: AC

## 2018-09-16 NOTE — Patient Instructions (Signed)
If you are age 43 or older, your body mass index should be between 23-30. Your Body mass index is 22.38 kg/m. If this is out of the aforementioned range listed, please consider follow up with your Primary Care Provider.  If you are age 6 or younger, your body mass index should be between 19-25. Your Body mass index is 22.38 kg/m. If this is out of the aformentioned range listed, please consider follow up with your Primary Care Provider.   You have been scheduled for an endoscopy and colonoscopy. Please follow the written instructions given to you at your visit today. Please pick up your prep supplies at the pharmacy within the next 1-3 days. If you use inhalers (even only as needed), please bring them with you on the day of your procedure. Your physician has requested that you go to www.startemmi.com and enter the access code given to you at your visit today. This web site gives a general overview about your procedure. However, you should still follow specific instructions given to you by our office regarding your preparation for the procedure.  It was a pleasure to see you today!  Dr. Myrtie Neither

## 2018-09-17 DIAGNOSIS — Z8719 Personal history of other diseases of the digestive system: Secondary | ICD-10-CM

## 2018-09-17 DIAGNOSIS — Z8619 Personal history of other infectious and parasitic diseases: Secondary | ICD-10-CM

## 2018-09-17 HISTORY — DX: Personal history of other infectious and parasitic diseases: Z86.19

## 2018-09-17 HISTORY — DX: Personal history of other diseases of the digestive system: Z87.19

## 2018-09-26 ENCOUNTER — Encounter: Payer: Self-pay | Admitting: Adult Health

## 2018-09-26 ENCOUNTER — Encounter: Payer: Self-pay | Admitting: Gastroenterology

## 2018-09-26 ENCOUNTER — Ambulatory Visit (INDEPENDENT_AMBULATORY_CARE_PROVIDER_SITE_OTHER): Payer: BLUE CROSS/BLUE SHIELD | Admitting: Adult Health

## 2018-09-26 VITALS — BP 107/71 | HR 70 | Temp 98.7°F | Ht 64.0 in | Wt 134.8 lb

## 2018-09-26 DIAGNOSIS — F411 Generalized anxiety disorder: Secondary | ICD-10-CM

## 2018-09-26 DIAGNOSIS — Z0000 Encounter for general adult medical examination without abnormal findings: Secondary | ICD-10-CM

## 2018-09-26 DIAGNOSIS — K921 Melena: Secondary | ICD-10-CM

## 2018-09-26 DIAGNOSIS — F5105 Insomnia due to other mental disorder: Secondary | ICD-10-CM

## 2018-09-26 DIAGNOSIS — M545 Low back pain, unspecified: Secondary | ICD-10-CM

## 2018-09-26 DIAGNOSIS — F99 Mental disorder, not otherwise specified: Secondary | ICD-10-CM

## 2018-09-26 MED ORDER — TRAZODONE HCL 100 MG PO TABS: ORAL_TABLET | ORAL | 2 refills | Status: DC

## 2018-09-26 MED ORDER — FLUOXETINE HCL 20 MG PO TABS: ORAL_TABLET | ORAL | 2 refills | Status: DC

## 2018-09-26 NOTE — Assessment & Plan Note (Signed)
Good luck on 24 Feb- EGD/Colonoscopy.

## 2018-09-26 NOTE — Assessment & Plan Note (Signed)
Continue Fluoxetine 20mg once daily and regular exercise. Increase Trazodone to 100mg once nightly as needed for insomnia, again continue regular exercise. Continue to abstain from tobacco- GREAT JOB! Increase plain water intake, strive for at least 70 oz/day. Good luck on 24 Feb- EGD/Colonoscopy. Follow-up 8 months for complete physical, please schedule fasting labs the week prior. 

## 2018-09-26 NOTE — Assessment & Plan Note (Signed)
Follow-up 8 months for complete physical, please schedule fasting labs the week prior.

## 2018-09-26 NOTE — Assessment & Plan Note (Signed)
Continue Fluoxetine 20mg  once daily and regular exercise. Increase Trazodone to 100mg  once nightly as needed for insomnia, again continue regular exercise. Continue to abstain from tobacco- GREAT JOB! Increase plain water intake, strive for at least 70 oz/day. Good luck on 24 Feb- EGD/Colonoscopy. Follow-up 8 months for complete physical, please schedule fasting labs the week prior.

## 2018-09-26 NOTE — Patient Instructions (Addendum)
Generalized Anxiety Disorder, Adult Generalized anxiety disorder (GAD) is a mental health disorder. People with this condition constantly worry about everyday events. Unlike normal anxiety, worry related to GAD is not triggered by a specific event. These worries also do not fade or get better with time. GAD interferes with life functions, including relationships, work, and school. GAD can vary from mild to severe. People with severe GAD can have intense waves of anxiety with physical symptoms (panic attacks). What are the causes? The exact cause of GAD is not known. What increases the risk? This condition is more likely to develop in:  Women.  People who have a family history of anxiety disorders.  People who are very shy.  People who experience very stressful life events, such as the death of a loved one.  People who have a very stressful family environment. What are the signs or symptoms? People with GAD often worry excessively about many things in their lives, such as their health and family. They may also be overly concerned about:  Doing well at work.  Being on time.  Natural disasters.  Friendships. Physical symptoms of GAD include:  Fatigue.  Muscle tension or having muscle twitches.  Trembling or feeling shaky.  Being easily startled.  Feeling like your heart is pounding or racing.  Feeling out of breath or like you cannot take a deep breath.  Having trouble falling asleep or staying asleep.  Sweating.  Nausea, diarrhea, or irritable bowel syndrome (IBS).  Headaches.  Trouble concentrating or remembering facts.  Restlessness.  Irritability. How is this diagnosed? Your health care provider can diagnose GAD based on your symptoms and medical history. You will also have a physical exam. The health care provider will ask specific questions about your symptoms, including how severe they are, when they started, and if they come and go. Your health care  provider may ask you about your use of alcohol or drugs, including prescription medicines. Your health care provider may refer you to a mental health specialist for further evaluation. Your health care provider will do a thorough examination and may perform additional tests to rule out other possible causes of your symptoms. To be diagnosed with GAD, a person must have anxiety that:  Is out of his or her control.  Affects several different aspects of his or her life, such as work and relationships.  Causes distress that makes him or her unable to take part in normal activities.  Includes at least three physical symptoms of GAD, such as restlessness, fatigue, trouble concentrating, irritability, muscle tension, or sleep problems. Before your health care provider can confirm a diagnosis of GAD, these symptoms must be present more days than they are not, and they must last for six months or longer. How is this treated? The following therapies are usually used to treat GAD:  Medicine. Antidepressant medicine is usually prescribed for long-term daily control. Antianxiety medicines may be added in severe cases, especially when panic attacks occur.  Talk therapy (psychotherapy). Certain types of talk therapy can be helpful in treating GAD by providing support, education, and guidance. Options include: ? Cognitive behavioral therapy (CBT). People learn coping skills and techniques to ease their anxiety. They learn to identify unrealistic or negative thoughts and behaviors and to replace them with positive ones. ? Acceptance and commitment therapy (ACT). This treatment teaches people how to be mindful as a way to cope with unwanted thoughts and feelings. ? Biofeedback. This process trains you to manage your body's response (  physiological response) through breathing techniques and relaxation methods. You will work with a therapist while machines are used to monitor your physical symptoms.  Stress  management techniques. These include yoga, meditation, and exercise. A mental health specialist can help determine which treatment is best for you. Some people see improvement with one type of therapy. However, other people require a combination of therapies. Follow these instructions at home:  Take over-the-counter and prescription medicines only as told by your health care provider.  Try to maintain a normal routine.  Try to anticipate stressful situations and allow extra time to manage them.  Practice any stress management or self-calming techniques as taught by your health care provider.  Do not punish yourself for setbacks or for not making progress.  Try to recognize your accomplishments, even if they are small.  Keep all follow-up visits as told by your health care provider. This is important. Contact a health care provider if:  Your symptoms do not get better.  Your symptoms get worse.  You have signs of depression, such as: ? A persistently sad, cranky, or irritable mood. ? Loss of enjoyment in activities that used to bring you joy. ? Change in weight or eating. ? Changes in sleeping habits. ? Avoiding friends or family members. ? Loss of energy for normal tasks. ? Feelings of guilt or worthlessness. Get help right away if:  You have serious thoughts about hurting yourself or others. If you ever feel like you may hurt yourself or others, or have thoughts about taking your own life, get help right away. You can go to your nearest emergency department or call:  Your local emergency services (911 in the [place]  A suicide crisis helpline, such as the National Suicide Prevention Lifeline at 250-329-38171-(681) 581-0186. This is open 24 hours a day. Summary  Generalized anxiety disorder (GAD) is a mental health disorder that involves worry that is not triggered by a specific event.  People with GAD often worry excessively about many things in their lives, such as their health and  family.  GAD may cause physical symptoms such as restlessness, trouble concentrating, sleep problems, frequent sweating, nausea, diarrhea, headaches, and trembling or muscle twitching.  A mental health specialist can help determine which treatment is best for you. Some people see improvement with one type of therapy. However, other people require a combination of therapies. This information is not intended to replace advice given to you by your health care provider. Make sure you discuss any questions you have with your health care provider. Document Released: 11/28/2012 Document Revised: 06/23/2016 Document Reviewed: 06/23/2016 Elsevier Interactive Patient Education  2019 [employer]   Insomnia Insomnia is a sleep disorder that makes it difficult to fall asleep or stay asleep. Insomnia can cause fatigue, low energy, difficulty concentrating, mood swings, and poor performance at work or school. There are three different ways to classify insomnia:  Difficulty falling asleep.  Difficulty staying asleep.  Waking up too early in the morning. Any type of insomnia can be long-term (chronic) or short-term (acute). Both are common. Short-term insomnia usually lasts for three months or less. Chronic insomnia occurs at least three times a week for longer than three months. What are the causes? Insomnia may be caused by another condition, situation, or substance, such as:  Anxiety.  Certain medicines.  Gastroesophageal reflux disease (GERD) or other gastrointestinal conditions.  Asthma or other breathing conditions.  Restless legs syndrome, sleep apnea, or other sleep disorders.  Chronic pain.  Menopause.  Stroke.  Abuse of alcohol, tobacco, or illegal drugs.  Mental health conditions, such as depression.  Caffeine.  Neurological disorders, such as Alzheimer's disease.  An overactive thyroid (hyperthyroidism). Sometimes, the cause of insomnia may not be known. What increases  the risk? Risk factors for insomnia include:  Gender. Women are affected more often than men.  Age. Insomnia is more common as you get older.  Stress.  Lack of exercise.  Irregular work schedule or working night shifts.  Traveling between different time zones.  Certain medical and mental health conditions. What are the signs or symptoms? If you have insomnia, the main symptom is having trouble falling asleep or having trouble staying asleep. This may lead to other symptoms, such as:  Feeling fatigued or having low energy.  Feeling nervous about going to sleep.  Not feeling rested in the morning.  Having trouble concentrating.  Feeling irritable, anxious, or depressed. How is this diagnosed? This condition may be diagnosed based on:  Your symptoms and medical history. Your health care provider may ask about: ? Your sleep habits. ? Any medical conditions you have. ? Your mental health.  A physical exam. How is this treated? Treatment for insomnia depends on the cause. Treatment may focus on treating an underlying condition that is causing insomnia. Treatment may also include:  Medicines to help you sleep.  Counseling or therapy.  Lifestyle adjustments to help you sleep better. Follow these instructions at home: Eating and drinking   Limit or avoid alcohol, caffeinated beverages, and cigarettes, especially close to bedtime. These can disrupt your sleep.  Do not eat a large meal or eat spicy foods right before bedtime. This can lead to digestive discomfort that can make it hard for you to sleep. Sleep habits   Keep a sleep diary to help you and your health care provider figure out what could be causing your insomnia. Write down: ? When you sleep. ? When you wake up during the night. ? How well you sleep. ? How rested you feel the next day. ? Any side effects of medicines you are taking. ? What you eat and drink.  Make your bedroom a dark, comfortable place  where it is easy to fall asleep. ? Put up shades or blackout curtains to block light from outside. ? Use a white noise machine to block noise. ? Keep the temperature cool.  Limit screen use before bedtime. This includes: ? Watching TV. ? Using your smartphone, tablet, or computer.  Stick to a routine that includes going to bed and waking up at the same times every day and night. This can help you fall asleep faster. Consider making a quiet activity, such as reading, part of your nighttime routine.  Try to avoid taking naps during the day so that you sleep better at night.  Get out of bed if you are still awake after 15 minutes of trying to sleep. Keep the lights down, but try reading or doing a quiet activity. When you feel sleepy, go back to bed. General instructions  Take over-the-counter and prescription medicines only as told by your health care provider.  Exercise regularly, as told by your health care provider. Avoid exercise starting several hours before bedtime.  Use relaxation techniques to manage stress. Ask your health care provider to suggest some techniques that may work well for you. These may include: ? Breathing exercises. ? Routines to release muscle tension. ? Visualizing peaceful scenes.  Make sure that you drive carefully. Avoid driving  if you feel very sleepy.  Keep all follow-up visits as told by your health care provider. This is important. Contact a health care provider if:  You are tired throughout the day.  You have trouble in your daily routine due to sleepiness.  You continue to have sleep problems, or your sleep problems get worse. Get help right away if:  You have serious thoughts about hurting yourself or someone else. If you ever feel like you may hurt yourself or others, or have thoughts about taking your own life, get help right away. You can go to your nearest emergency department or call:  Your local emergency services (911 in the  [place]  A suicide crisis helpline, such as the National Suicide Prevention Lifeline at 708-159-3001. This is open 24 hours a day. Summary  Insomnia is a sleep disorder that makes it difficult to fall asleep or stay asleep.  Insomnia can be long-term (chronic) or short-term (acute).  Treatment for insomnia depends on the cause. Treatment may focus on treating an underlying condition that is causing insomnia.  Keep a sleep diary to help you and your health care provider figure out what could be causing your insomnia. This information is not intended to replace advice given to you by your health care provider. Make sure you discuss any questions you have with your health care provider. Document Released: 07/31/2000 Document Revised: 05/13/2017 Document Reviewed: 05/13/2017 Elsevier Interactive Patient Education  2019 [employer]  Continue Fluoxetine 20mg  once daily and regular exercise. Increase Trazodone to 100mg  once nightly as needed for insomnia, again continue regular exercise. Continue to abstain from tobacco- GREAT JOB! Increase plain water intake, strive for at least 70 oz/day. Good luck on 24 Feb- EGD/Colonoscopy. Follow-up 8 months for complete physical, please schedule fasting labs the week prior. NICE TO SEE YOU!

## 2018-10-10 ENCOUNTER — Ambulatory Visit (AMBULATORY_SURGERY_CENTER): Payer: BLUE CROSS/BLUE SHIELD | Admitting: Gastroenterology

## 2018-10-10 ENCOUNTER — Telehealth: Payer: Self-pay | Admitting: Gastroenterology

## 2018-10-10 ENCOUNTER — Encounter: Payer: Self-pay | Admitting: Gastroenterology

## 2018-10-10 VITALS — BP 135/78 | HR 72 | Temp 97.7°F | Resp 16 | Ht 64.0 in | Wt 134.0 lb

## 2018-10-10 DIAGNOSIS — K2970 Gastritis, unspecified, without bleeding: Secondary | ICD-10-CM | POA: Diagnosis not present

## 2018-10-10 DIAGNOSIS — R1084 Generalized abdominal pain: Secondary | ICD-10-CM

## 2018-10-10 DIAGNOSIS — B9681 Helicobacter pylori [H. pylori] as the cause of diseases classified elsewhere: Secondary | ICD-10-CM

## 2018-10-10 DIAGNOSIS — R634 Abnormal weight loss: Secondary | ICD-10-CM

## 2018-10-10 DIAGNOSIS — R110 Nausea: Secondary | ICD-10-CM

## 2018-10-10 DIAGNOSIS — K625 Hemorrhage of anus and rectum: Secondary | ICD-10-CM

## 2018-10-10 DIAGNOSIS — K3189 Other diseases of stomach and duodenum: Secondary | ICD-10-CM | POA: Diagnosis not present

## 2018-10-10 DIAGNOSIS — K2950 Unspecified chronic gastritis without bleeding: Secondary | ICD-10-CM | POA: Diagnosis not present

## 2018-10-10 HISTORY — PX: COLONOSCOPY WITH ESOPHAGOGASTRODUODENOSCOPY (EGD): SHX5779

## 2018-10-10 MED ORDER — SODIUM CHLORIDE 0.9 % IV SOLN: 500.0000 mL | Freq: Once | INTRAVENOUS | Status: DC

## 2018-10-10 NOTE — Op Note (Signed)
[facility] Patient Name: Sheri Flores Procedure Date: 10/10/2018 2:19 PM MRN: [id number] Endoscopist: Sherilyn Cooter L. Myrtie Flores , MD Age: 43 Referring MD:  Date of Birth: 1976-02-14 Gender: Female Account #: [id number] Procedure:                Upper GI endoscopy Indications:              Generalized abdominal pain, Nausea, Weight loss Medicines:                Monitored Anesthesia Care Procedure:                Pre-Anesthesia Assessment:                           - Prior to the procedure, a History and Physical                            was performed, and patient medications and                            allergies were reviewed. The patient's tolerance of                            previous anesthesia was also reviewed. The risks                            and benefits of the procedure and the sedation                            options and risks were discussed with the patient.                            All questions were answered, and informed consent                            was obtained. Prior Anticoagulants: The patient has                            taken no previous anticoagulant or antiplatelet                            agents. ASA Grade Assessment: II - A patient with                            mild systemic disease. After reviewing the risks                            and benefits, the patient was deemed in                            satisfactory condition to undergo the procedure.                           After obtaining informed consent, the endoscope was  passed under direct vision. Throughout the                            procedure, the patient's blood pressure, pulse, and                            oxygen saturations were monitored continuously. The                            Endoscope was introduced through the mouth, and                            advanced to the second part of duodenum. The upper                            GI  endoscopy was accomplished without difficulty.                            The patient tolerated the procedure well. Scope In: Scope Out: Findings:                 The esophagus was normal.                           Diffuse congested mucosa was found in the gastric                            body. Biopsies were taken with a cold forceps for                            histology.                           Patchy erythematous mucosa was found in the gastric                            antrum. Biopsies were taken with a cold forceps for                            histology.                           The cardia and gastric fundus were normal on                            retroflexion (except for J-shape).                           The examined duodenum was normal. Biopsies for                            histology were taken with a cold forceps for                            evaluation of celiac disease. Complications:  No immediate complications. Estimated Blood Loss:     Estimated blood loss was minimal. Impression:               - Normal esophagus.                           - Congestive gastropathy. Biopsied.                           - Erythematous mucosa in the antrum. Biopsied.                           - Normal examined duodenum. Biopsied.                           No clear explanation for abdominal pain or weight                            loss was seen on these exams. Recommendation:           - Patient has a contact number available for                            emergencies. The signs and symptoms of potential                            delayed complications were discussed with the                            patient. Return to normal activities tomorrow.                            Written discharge instructions were provided to the                            patient.                           - Resume previous diet.                           - Continue present  medications.                           - Await pathology results.                           - See the other procedure note for documentation of                            additional recommendations.                           - Perform a CT scan (computed tomography) of                            abdomen with contrast and pelvis with contrast at  appointment to be scheduled. Sheri L. Myrtie Neither, MD 10/10/2018 3:06:37 PM This report has been signed electronically.

## 2018-10-10 NOTE — Progress Notes (Signed)
Report given to PACU, vss 

## 2018-10-10 NOTE — Progress Notes (Signed)
Called to room to assist during endoscopic procedure.  Patient ID and intended procedure confirmed with present staff. Received instructions for my participation in the procedure from the performing physician.  

## 2018-10-10 NOTE — Telephone Encounter (Signed)
Please arrange CT scan abdomen and pelvis with oral and IV contrast.  Dx:  Generalized abdominal pain, weight loss.  No cause found on EGD/colonoscopy  (She was already given oral contrast in [facility] today)

## 2018-10-10 NOTE — Op Note (Signed)
[facility] Patient Name: Sheri Flores Procedure Date: 10/10/2018 2:20 PM MRN: [id number] Endoscopist: Sherilyn Cooter L. Myrtie Neither , MD Age: 43 Referring MD:  Date of Birth: 1976/06/12 Gender: Female Account #: [id number] Procedure:                Colonoscopy Indications:              Generalized abdominal pain, Rectal bleeding, Weight                            loss Medicines:                Monitored Anesthesia Care Procedure:                Pre-Anesthesia Assessment:                           - Prior to the procedure, a History and Physical                            was performed, and patient medications and                            allergies were reviewed. The patient's tolerance of                            previous anesthesia was also reviewed. The risks                            and benefits of the procedure and the sedation                            options and risks were discussed with the patient.                            All questions were answered, and informed consent                            was obtained. Prior Anticoagulants: The patient has                            taken no previous anticoagulant or antiplatelet                            agents. ASA Grade Assessment: II - A patient with                            mild systemic disease. After reviewing the risks                            and benefits, the patient was deemed in                            satisfactory condition to undergo the procedure.  After obtaining informed consent, the colonoscope                            was passed under direct vision. Throughout the                            procedure, the patient's blood pressure, pulse, and                            oxygen saturations were monitored continuously. The                            Colonoscope was introduced through the anus and                            advanced to the the cecum, identified by               appendiceal orifice and ileocecal valve. The                            colonoscopy was performed without difficulty. The                            patient tolerated the procedure well. The quality                            of the bowel preparation was good. The ileocecal                            valve, appendiceal orifice, and rectum were                            photographed. Scope In: 2:35:39 PM Scope Out: 2:46:40 PM Scope Withdrawal Time: 0 hours 9 minutes 5 seconds  Total Procedure Duration: 0 hours 11 minutes 1 second  Findings:                 The digital rectal exam findings include decreased                            sphincter tone.                           Internal hemorrhoids were found.                           Retroflexion in the rectum was not performed due to                            anatomy.                           The exam was otherwise without abnormality. Complications:            No immediate complications. Estimated Blood Loss:     Estimated blood loss: none. Impression:               -  Decreased sphincter tone found on digital rectal                            exam.                           - Internal hemorrhoids.                           - The examination was otherwise normal.                           - No specimens collected. Recommendation:           - Patient has a contact number available for                            emergencies. The signs and symptoms of potential                            delayed complications were discussed with the                            patient. Return to normal activities tomorrow.                            Written discharge instructions were provided to the                            patient.                           - Resume previous diet.                           - Continue present medications.                           - Repeat colonoscopy in 10 years for screening                             purposes.                           - See the other procedure note for documentation of                            additional recommendations. [name] Neitheranis, MD 10/10/2018 3:01:56 PM This report has been signed electronically.

## 2018-10-10 NOTE — Patient Instructions (Addendum)
Impression/Recommendations:  Hemorrhoid handout given to patient.  Resume previous diet. Continue present medications.  Await pathology results.  Perform a CT scan of the abdomen with contrast and pelvis with contrast at appointment to be scheduled.  Contrast and instruction sheet send home with pt.  Repeat colonoscopy in 10 years for screening purposes.  YOU HAD AN ENDOSCOPIC PROCEDURE TODAY AT THE [facility]:   Refer to the procedure report that was given to you for any specific questions about what was found during the examination.  If the procedure report does not answer your questions, please call your gastroenterologist to clarify.  If you requested that your care partner not be given the details of your procedure findings, then the procedure report has been included in a sealed envelope for you to review at your convenience later.  YOU SHOULD EXPECT: Some feelings of bloating in the abdomen. Passage of more gas than usual.  Walking can help get rid of the air that was put into your GI tract during the procedure and reduce the bloating. If you had a lower endoscopy (such as a colonoscopy or flexible sigmoidoscopy) you may notice spotting of blood in your stool or on the toilet paper. If you underwent a bowel prep for your procedure, you may not have a normal bowel movement for a few days.  Please Note:  You might notice some irritation and congestion in your nose or some drainage.  This is from the oxygen used during your procedure.  There is no need for concern and it should clear up in a day or so.  SYMPTOMS TO REPORT IMMEDIATELY:   Following lower endoscopy (colonoscopy or flexible sigmoidoscopy):  Excessive amounts of blood in the stool  Significant tenderness or worsening of abdominal pains  Swelling of the abdomen that is new, acute  Fever of 100F or higher   Following upper endoscopy (EGD)  Vomiting of blood or coffee ground material  New chest pain or pain  under the shoulder blades  Painful or persistently difficult swallowing  New shortness of breath  Fever of 100F or higher  Black, tarry-looking stools  For urgent or emergent issues, a gastroenterologist can be reached at any hour by calling [phone].   DIET:  We do recommend a small meal at first, but then you may proceed to your regular diet.  Drink plenty of fluids but you should avoid alcoholic beverages for 24 hours.  ACTIVITY:  You should plan to take it easy for the rest of today and you should NOT DRIVE or use heavy machinery until tomorrow (because of the sedation medicines used during the test).    FOLLOW UP: Our staff will call the number listed on your records the next business day following your procedure to check on you and address any questions or concerns that you may have regarding the information given to you following your procedure. If we do not reach you, we will leave a message.  However, if you are feeling well and you are not experiencing any problems, there is no need to return our call.  We will assume that you have returned to your regular daily activities without incident.  If any biopsies were taken you will be contacted by phone or by letter within the next 1-3 weeks.  Please call us at [phone] if you have not heard about the biopsies in 3 weeks.    SIGNATURES/CONFIDENTIALITY: You and/or your care partner have signed paperwork which will be entered  into your electronic medical record.  These signatures attest to the fact that that the information above on your After Visit Summary has been reviewed and is understood.  Full responsibility of the confidentiality of this discharge information lies with you and/or your care-partner.

## 2018-10-11 ENCOUNTER — Telehealth: Payer: Self-pay

## 2018-10-11 ENCOUNTER — Other Ambulatory Visit: Payer: Self-pay

## 2018-10-11 DIAGNOSIS — R634 Abnormal weight loss: Secondary | ICD-10-CM

## 2018-10-11 DIAGNOSIS — R109 Unspecified abdominal pain: Secondary | ICD-10-CM

## 2018-10-11 NOTE — Telephone Encounter (Signed)
  Follow up Call-  Call back number 10/10/2018  Post procedure Call Back phone  # [phone]  Permission to leave phone message Yes  Some recent data might be hidden     Patient questions:  Do you have a fever, pain , or abdominal swelling? No. Pain Score  0 *  Have you tolerated food without any problems? Yes.    Have you been able to return to your normal activities? Yes.    Do you have any questions about your discharge instructions: Diet   No. Medications  No. Follow up visit  No.  Do you have questions or concerns about your Care? No.  Actions: * If pain score is 4 or above: No action needed, pain <4.  Pt asked about CT scan, advised that the office will call to give her a date and time.   She acknowledged.

## 2018-10-11 NOTE — Telephone Encounter (Signed)
Pt scheduled for CT of A/P at [facility] CT 10/19/18@2 :30pm, pt to arrive there at 2:15pm. Pt to have no solid food after 10:30am, drink bottle 1 of contrast at 12:30pm, and bottle 2 at 1:30pm. Pt aware of appt.

## 2018-10-14 ENCOUNTER — Telehealth: Payer: Self-pay | Admitting: Gastroenterology

## 2018-10-14 NOTE — Telephone Encounter (Signed)
Pt scheduled for GES at [facility] 10/25/18@9 :30am, pt to arrive there at 9:15am. Pt to be NPO after midnight and hold stomach med for 8 hours prior to test. Pt aware.

## 2018-10-14 NOTE — Telephone Encounter (Signed)
Pt called in wanting to discuss results with the nurse

## 2018-10-17 ENCOUNTER — Other Ambulatory Visit: Payer: Self-pay

## 2018-10-17 MED ORDER — OMEPRAZOLE 40 MG PO CPDR: 40.0000 mg | DELAYED_RELEASE_CAPSULE | Freq: Two times a day (BID) | ORAL | 0 refills | Status: DC

## 2018-10-17 MED ORDER — AMOXICILLIN 500 MG PO CAPS: 500.0000 mg | ORAL_CAPSULE | Freq: Two times a day (BID) | ORAL | 0 refills | Status: DC

## 2018-10-17 MED ORDER — LEVOFLOXACIN 500 MG PO TABS: 500.0000 mg | ORAL_TABLET | Freq: Every day | ORAL | 0 refills | Status: DC

## 2018-10-19 ENCOUNTER — Ambulatory Visit (INDEPENDENT_AMBULATORY_CARE_PROVIDER_SITE_OTHER)
Admission: RE | Admit: 2018-10-19 | Discharge: 2018-10-19 | Disposition: A | Discharge status: Home / Self Care | Payer: BLUE CROSS/BLUE SHIELD | Source: Ambulatory Visit | Attending: Gastroenterology | Admitting: Gastroenterology

## 2018-10-19 DIAGNOSIS — R109 Unspecified abdominal pain: Secondary | ICD-10-CM

## 2018-10-19 DIAGNOSIS — R634 Abnormal weight loss: Secondary | ICD-10-CM | POA: Diagnosis not present

## 2018-10-19 DIAGNOSIS — R1084 Generalized abdominal pain: Secondary | ICD-10-CM | POA: Diagnosis not present

## 2018-10-19 MED ORDER — IOHEXOL 300 MG/ML  SOLN
75.0000 mL | Freq: Once | INTRAMUSCULAR | Status: AC | PRN
  Administered 2018-10-19: 75 mL via INTRAVENOUS

## 2018-10-20 ENCOUNTER — Other Ambulatory Visit: Payer: Self-pay | Admitting: Gastroenterology

## 2018-11-01 ENCOUNTER — Telehealth: Payer: Self-pay

## 2018-11-01 NOTE — Telephone Encounter (Signed)
Covid-19 travel screening questions  Have you traveled in the last 14 days? If yes where?  Do you now or have you had a fever in the last 14 days?  Do you have any respiratory symptoms of shortness of breath or cough now or in the last 14 days?  Do you have a medical history of Congestive Heart Failure? N/A  Do you have a medical history of lung disease? N/A  Do you have any family members or close contacts with diagnosed or suspected Covid-19?  Left message for pt to call back and reschedule their appt if they answer yes to any of the above questions.

## 2018-11-02 ENCOUNTER — Other Ambulatory Visit: Payer: Self-pay

## 2018-11-02 ENCOUNTER — Encounter: Payer: Self-pay | Admitting: Gastroenterology

## 2018-11-02 ENCOUNTER — Ambulatory Visit (INDEPENDENT_AMBULATORY_CARE_PROVIDER_SITE_OTHER): Payer: BLUE CROSS/BLUE SHIELD | Admitting: Gastroenterology

## 2018-11-02 VITALS — BP 102/74 | HR 72 | Temp 97.6°F | Ht 64.0 in | Wt 131.8 lb

## 2018-11-02 DIAGNOSIS — R1084 Generalized abdominal pain: Secondary | ICD-10-CM

## 2018-11-02 DIAGNOSIS — R634 Abnormal weight loss: Secondary | ICD-10-CM | POA: Diagnosis not present

## 2018-11-02 DIAGNOSIS — A048 Other specified bacterial intestinal infections: Secondary | ICD-10-CM | POA: Diagnosis not present

## 2018-11-02 NOTE — Patient Instructions (Signed)
If you are age 43 or older, your body mass index should be between 23-30. Your Body mass index is 22.62 kg/m. If this is out of the aforementioned range listed, please consider follow up with your Primary Care Provider.  If you are age 42 or younger, your body mass index should be between 19-25. Your Body mass index is 22.62 kg/m. If this is out of the aformentioned range listed, please consider follow up with your Primary Care Provider.   Please have the breath test completed in 3-4 weeks.   It was a pleasure to see you today!  Dr. Myrtie Neither

## 2018-11-02 NOTE — Progress Notes (Signed)
     [facility] Progress Note  Chief Complaint: Generalized abdominal pain  Subjective  History:  Initially seen in late January for about a year of generalized abdominal pain and weight loss.  Gynecologic work-up done, underlying anxiety disorder.  See clinic note from that day exam findings and impression.  On 10/10/2018, EGD only notable for nonspecific mild gastric mucosal edema.  Biopsy positive for H. pylori, treated with amoxicillin and levofloxacin.  Colonoscopy same day was complete to cecum with good preparation, normal except for small internal hemorrhoids to explain rectal bleeding.  Subsequent CT abdomen and pelvis with contrast on 10/19/2018 was normal.  She is glad to report that she is feeling much better.  Sheri Flores's abdominal pain has all but resolved her appetite is returned and weight is increasing.  Bowel habits are regular, no recent rectal bleeding.  ROS: Cardiovascular:  no chest pain Respiratory: no dyspnea  The patient's Past Medical, Family and Social History were reviewed and are on file in the EMR.  Objective:  Med list reviewed  Current Outpatient Medications:  .  albuterol (PROVENTIL HFA;VENTOLIN HFA) 108 (90 Base) MCG/ACT inhaler, Inhale 2 puffs into the lungs every 6 (six) hours as needed for wheezing or shortness of breath., Disp: 1 Inhaler, Rfl: 0 .  amoxicillin (AMOXIL) 500 MG capsule, Take 1 capsule (500 mg total) by mouth 2 (two) times daily., Disp: 40 capsule, Rfl: 0 .  FLUoxetine (PROZAC) 20 MG tablet, 1/2 tablet for one week then increase to one full tablet daily, Disp: 90 tablet, Rfl: 2 .  omeprazole (PRILOSEC) 40 MG capsule, Take 1 capsule (40 mg total) by mouth daily., Disp: 30 capsule, Rfl: 1 .  omeprazole (PRILOSEC) 40 MG capsule, Take 1 capsule (40 mg total) by mouth 2 (two) times daily., Disp: 20 capsule, Rfl: 0 .  traZODone (DESYREL) 100 MG tablet, 1 tablet by mouth as needed at bedtime for insomnia, Disp: 30 tablet, Rfl: 2  Current  Facility-Administered Medications:  .  0.9 %  sodium chloride infusion, 500 mL, Intravenous, Once, Danis, Starr Lake III, MD   Vital signs in last 24 hrs: Vitals:   11/02/18 1344  BP: 102/74  Pulse: 72  Temp: 97.6 F (36.4 C)    Physical Exam    HEENT: sclera anicteric, oral mucosa moist without lesions, poor dentition  Neck: supple, no thyromegaly, JVD or lymphadenopathy  Cardiac: RRR without murmurs, S1S2 heard, no peripheral edema  Pulm: clear to auscultation bilaterally, normal RR and effort noted  Abdomen: soft, no tenderness, with active bowel sounds. No guarding or palpable hepatosplenomegaly.  Skin; warm and dry, no jaundice or rash  Recent data:  Biopsy reports and CT report as noted above    @ASSESSMENTPLANBEGIN @ Assessment: Encounter Diagnoses  Name Primary?  . Generalized abdominal pain Yes  . Weight loss   . H. pylori infection    The H. pylori appears to have accounted for all her symptoms, though endoscopic findings were mild.   Plan: Urea breath test in 3 to 4 weeks Stop omeprazole   Total time 15 minutes, all spent face-to-face with patient in counseling and coordination of care.   Charlie Pitter III

## 2018-11-21 ENCOUNTER — Encounter: Payer: Self-pay | Admitting: Adult Health

## 2018-11-21 ENCOUNTER — Ambulatory Visit (INDEPENDENT_AMBULATORY_CARE_PROVIDER_SITE_OTHER): Payer: BLUE CROSS/BLUE SHIELD | Admitting: Adult Health

## 2018-11-21 ENCOUNTER — Telehealth: Payer: Self-pay

## 2018-11-21 ENCOUNTER — Other Ambulatory Visit: Payer: Self-pay

## 2018-11-21 VITALS — Temp 96.8°F

## 2018-11-21 DIAGNOSIS — F17210 Nicotine dependence, cigarettes, uncomplicated: Secondary | ICD-10-CM

## 2018-11-21 DIAGNOSIS — F99 Mental disorder, not otherwise specified: Secondary | ICD-10-CM | POA: Diagnosis not present

## 2018-11-21 DIAGNOSIS — F5105 Insomnia due to other mental disorder: Secondary | ICD-10-CM | POA: Diagnosis not present

## 2018-11-21 DIAGNOSIS — B001 Herpesviral vesicular dermatitis: Secondary | ICD-10-CM | POA: Diagnosis not present

## 2018-11-21 MED ORDER — ACYCLOVIR 5 % EX CREA: 1.0000 "application " | TOPICAL_CREAM | CUTANEOUS | 0 refills | Status: DC | PRN

## 2018-11-21 MED ORDER — CLONAZEPAM 0.5 MG PO TABS: ORAL_TABLET | ORAL | 1 refills | Status: DC

## 2018-11-21 NOTE — Telephone Encounter (Signed)
MyChart message sent to pt with instructions.  T. Nelson, CMA 

## 2018-11-21 NOTE — Telephone Encounter (Signed)
Pt c/o bad nightmares r/t trazodone use.  Pt stopped the medication and nightmares have resolved.  However, pt states that she still needs something for insomnia.     Pt states that she has several "cold sores" on her lips.  She has tried Blistex with no relief.  Pt states that they have been getting worse.  Onset was 2 days ago.  Please advise.  Sheri Flores, CMA

## 2018-11-21 NOTE — Progress Notes (Signed)
Virtual Visit via Telephone Note  I connected with Sheri Flores on 11/21/18 at  3:45 PM EDT by telephone and verified that I am speaking with the correct person using two identifiers.   I discussed the limitations, risks, security and privacy concerns of performing an evaluation and management service by telephone and the availability of in person appointments. The staff discussed with the patient that there may be a patient responsible charge related to this service. The patient expressed understanding and agreed to proceed.   History of Present Illness: Ms. 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 She denies previous testing for HSV 2) She reports nightmares with Trazodone 100mg  QHS Last dose was 7 days ago and nightmares abruptly stopped She reports insomnia started >12 months ago She has tried OTC Melatonin without much relief. She states "my body goes to sleep before my mind does" and "my mind just seems to race" She continues to walk daily with her dogs, she estimates to walk at least 3 miles/day  She denies ETOH use She remains off tobacco use  She has been taking Fluoxetine 20mg  QD- reports stable mood, denies SI/HI She has upcoming appt with dentist for full set of dentures and hopes to secure employment soon thereafter- which will hopefully also reduce overall anxiety levels   Patient Care Team    Relationship Specialty Notifications Start End  Julaine Fusi, NP PCP - General Family Medicine  03/22/18     Patient Active Problem List   Diagnosis Date Noted  . 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 GAD (generalized anxiety disorder) 08/29/2018  . 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 Hematochezia 08/29/2018  . Bronchitis 08/02/2018  . Acute bilateral low back pain without sciatica  06/06/2018  . Healthcare maintenance 05/09/2018  . Loose stools 05/09/2018  . Abnormal urinalysis 05/09/2018     Past Medical History:  Diagnosis Date  . Anxiety   . Hemorrhoids      Past Surgical History:  Procedure Laterality Date  . TONSILLECTOMY    . TUBAL LIGATION       Family History  Problem Relation Age of Onset  . Breast cancer Paternal Grandmother 8  . Breast cancer Maternal Grandmother   . Colon cancer Paternal Grandfather   . Rectal cancer Neg Hx   . Stomach cancer Neg Hx   . Esophageal cancer Neg Hx      Social History   Substance and Sexual Activity  Drug Use Not Currently  . Types: Marijuana     Social History   Substance and Sexual Activity  Alcohol Use Yes  . Frequency: Never   Comment: socially     Social History   Tobacco Use  Smoking Status Current Every Day Smoker  . Packs/day: 0.50  . Years: 20.00  . Pack years: 10.00  . Types: Cigarettes  Smokeless Tobacco Never Used     Outpatient Encounter Medications as of 11/21/2018  Medication Sig  . albuterol (PROVENTIL HFA;VENTOLIN HFA) 108 (90 Base) MCG/ACT inhaler Inhale 2 puffs into the lungs every 6 (six) hours as needed for wheezing or shortness of breath.  Marland Kitchen FLUoxetine (PROZAC) 20 MG tablet 1/2 tablet for one week then increase to one full tablet daily  . acyclovir cream (ZOVIRAX) 5 % Apply 1 application topically every 5 (five) hours  as needed.  . clonazePAM (KLONOPIN) 0.5 MG tablet 1/2 to 1 tablet as needed at bedtime  . [DISCONTINUED] amoxicillin (AMOXIL) 500 MG capsule Take 1 capsule (500 mg total) by mouth 2 (two) times daily.  . [DISCONTINUED] omeprazole (PRILOSEC) 40 MG capsule Take 1 capsule (40 mg total) by mouth daily.  . [DISCONTINUED] omeprazole (PRILOSEC) 40 MG capsule Take 1 capsule (40 mg total) by mouth 2 (two) times daily.  . [DISCONTINUED] traZODone (DESYREL) 100 MG tablet 1 tablet by mouth as needed at bedtime for insomnia   Facility-Administered Encounter  Medications as of 11/21/2018  Medication  . 0.9 %  sodium chloride infusion    Allergies: Hydrocodone and Trazodone and nefazodone  There is no height or weight on file to calculate BMI.  Temperature (!) 96.8 F (36 C), temperature source Oral, last menstrual period 11/01/2018.    Observations/Objective: She sounds hoarse on the telephone, however this is her baseline voice  Assessment and Plan: Acyclovir 5% cream- apply Q5H PRN for herpes herpes labialis  Stop trazodone, re: nightmares  [place] Controlled Substance Database reviewed- no aberrancies noted. One prescription only of Clonazepam 0.5mg  will be provided to assist her on returning to normal sleep cycle. Continue regular exercise and avoid excessive caffeine Good luck with upcoming dental appt Good luck with job search   Follow Up Instructions: PRN   I discussed the assessment and treatment plan with the patient. The patient was provided an opportunity to ask questions and all were answered. The patient agreed with the plan and demonstrated an understanding of the instructions.   The patient was advised to call back or seek an in-person evaluation if the symptoms worsen or if the condition fails to improve as anticipated.  I provided 18 minutes of non-face-to-face time during this encounter.   Julaine Fusi, NP

## 2018-11-21 NOTE — Assessment & Plan Note (Signed)
Assessment and Plan: Acyclovir 5% cream- apply Q5H PRN for herpes herpes labialis  Stop trazodone, re: nightmares  [place] Controlled Substance Database reviewed- no aberrancies noted. One prescription only of Clonazepam 0.5mg  will be provided to assist her on returning to normal sleep cycle. Continue regular exercise and avoid excessive caffeine Good luck with upcoming dental appt Good luck with job search   Follow Up Instructions: PRN   I discussed the assessment and treatment plan with the patient. The patient was provided an opportunity to ask questions and all were answered. The patient agreed with the plan and demonstrated an understanding of the instructions.   The patient was advised to call back or seek an in-person evaluation if the symptoms worsen or if the condition fails to improve as anticipated.  I provided 18 minutes of non-face-to-face time during this encounter.

## 2018-11-21 NOTE — Assessment & Plan Note (Signed)
Assessment and Plan: Acyclovir 5% cream- apply Q5H PRN for herpes herpes labialis  [place] Controlled Substance Database reviewed- no aberrancies noted. One prescription only of Clonazepam 0.5mg  will be provided to assist her on returning to normal sleep cycle. Continue regular exercise and avoid excessive caffeine Good luck with upcoming dental appt Good luck with job search   Follow Up Instructions: PRN   I discussed the assessment and treatment plan with the patient. The patient was provided an opportunity to ask questions and all were answered. The patient agreed with the plan and demonstrated an understanding of the instructions.   The patient was advised to call back or seek an in-person evaluation if the symptoms worsen or if the condition fails to improve as anticipated.  I provided 18 minutes of non-face-to-face time during this encounter.

## 2018-11-21 NOTE — Telephone Encounter (Signed)
Please have her schedule and telephone e visit Thanks! Orpha Bur

## 2018-11-30 ENCOUNTER — Encounter: Payer: Self-pay | Admitting: Adult Health

## 2018-11-30 ENCOUNTER — Other Ambulatory Visit: Payer: Self-pay

## 2018-11-30 ENCOUNTER — Ambulatory Visit (INDEPENDENT_AMBULATORY_CARE_PROVIDER_SITE_OTHER): Payer: BLUE CROSS/BLUE SHIELD | Admitting: Adult Health

## 2018-11-30 VITALS — Temp 97.9°F | Ht 64.0 in | Wt 135.0 lb

## 2018-11-30 DIAGNOSIS — R030 Elevated blood-pressure reading, without diagnosis of hypertension: Secondary | ICD-10-CM

## 2018-11-30 NOTE — Assessment & Plan Note (Signed)
Assessment and Plan: Continue to follow DASH diet Abstain from all tobacco/vape products Increase water intake and avoid soda/caffiene Check BP and HR daily, alternate arms  Check at different times of day Provide readings at f/u in 1 week Discussed Red Flag signs and to seek immediate medical assistance if any develop- pt communicated understanding/agreement  Follow Up Instructions: F/u one week- telemedicine    I discussed the assessment and treatment plan with the patient. The patient was provided an opportunity to ask questions and all were answered. The patient agreed with the plan and demonstrated an understanding of the instructions.   The patient was advised to call back or seek an in-person evaluation if the symptoms worsen or if the condition fails to improve as anticipated.

## 2018-11-30 NOTE — Progress Notes (Signed)
Virtual Visit via Telephone Note  I connected with Sheri Flores on 11/30/18 at  3:00 PM EDT by telephone and verified that I am speaking with the correct person using two identifiers.   I discussed the limitations, risks, security and privacy concerns of performing an evaluation and management service by telephone and the availability of in person appointments.The staff discussed with the patient that there may be a patient responsible charge related to this service. The patient expressed understanding and agreed to proceed.   History of Present Illness: Sheri Flores calls in today with concerns about elevated BP She reports experiencing dizziness and lightheadedness two mornings ago that lasted about 15 mins.  She went to her mother in laws home a few hours later and checked her BP- 158/94, repeat BP 148/90 She checked her BP yesterday and it was Yes 160/100, repeat pressure around the same reading. All readings taken in L arm, while sitting. She denies CP/chest pressure She denies dyspnea/cough/N/V/D/diaphoresis  She denies previous elevated BP readings She continues to abstain from tobacco use, but did admit to using vape products intermittently the last few weeks. She has been trying to reduce soda intake and and sodium intake, however had "cube steak for dinner last night". Reviewed usual foods that are high in Na+, which can cause increase in BP She estimates to only consume 1-2 beers per weekend She reports that her mother is treated for HTN   Patient Care Team    Relationship Specialty Notifications Start End  Julaine Fusi, NP PCP - General Family Medicine  03/22/18     Patient Active Problem List   Diagnosis Date Noted  . 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 GAD (generalized anxiety disorder) 08/29/2018  . 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 Hematochezia 08/29/2018  . Bronchitis 08/02/2018  . Acute bilateral low back pain without sciatica 06/06/2018  .  Healthcare maintenance 05/09/2018  . Loose stools 05/09/2018  . Abnormal urinalysis 05/09/2018     Past Medical History:  Diagnosis Date  . Anxiety   . Hemorrhoids      Past Surgical History:  Procedure Laterality Date  . TONSILLECTOMY    . TUBAL LIGATION       Family History  Problem Relation Age of Onset  . Breast cancer Paternal Grandmother 36  . Breast cancer Maternal Grandmother   . Colon cancer Paternal Grandfather   . Rectal cancer Neg Hx   . Stomach cancer Neg Hx   . Esophageal cancer Neg Hx      Social History   Substance and Sexual Activity  Drug Use Not Currently  . Types: Marijuana     Social History   Substance and Sexual Activity  Alcohol Use Yes  . Frequency: Never   Comment: socially     Social History   Tobacco Use  Smoking Status Current Every Day Smoker  . Packs/day: 0.50  . Years: 20.00  . Pack years: 10.00  . Types: Cigarettes  Smokeless Tobacco Never Used     Outpatient Encounter Medications as of 11/30/2018  Medication Sig  . acyclovir cream (ZOVIRAX) 5 % Apply 1 application topically every 5 (five) hours as needed.  Marland Kitchen albuterol (PROVENTIL HFA;VENTOLIN HFA) 108 (90 Base) MCG/ACT inhaler Inhale 2 puffs into the lungs every 6 (six) hours as needed for wheezing or shortness of breath.  . clonazePAM (KLONOPIN) 0.5 MG tablet 1/2 to 1 tablet as needed at bedtime  . FLUoxetine (PROZAC) 20 MG tablet 1/2 tablet  for one week then increase to one full tablet daily   Facility-Administered Encounter Medications as of 11/30/2018  Medication  . 0.9 %  sodium chloride infusion    Allergies: Hydrocodone and Trazodone and nefazodone  Body mass index is 23.17 kg/m.  Temperature 97.9 F (36.6 C), temperature source Oral, height 5\' 4"  (1.626 m), weight 135 lb (61.2 kg), last menstrual period 10/24/2018.  Observations/Objective: No acute distress noted over the telephone  Assessment and Plan: Continue to follow DASH diet Abstain  from all tobacco/vape products Increase water intake and avoid soda/caffiene Check BP and HR daily, alternate arms  Check at different times of day Provide readings at f/u in 1 week Discussed Red Flag signs and to seek immediate medical assistance if any develop- pt communicated understanding/agreement  Follow Up Instructions: F/u one week- telemedicine    I discussed the assessment and treatment plan with the patient. The patient was provided an opportunity to ask questions and all were answered. The patient agreed with the plan and demonstrated an understanding of the instructions.   The patient was advised to call back or seek an in-person evaluation if the symptoms worsen or if the condition fails to improve as anticipated.  I provided 18 minutes of non-face-to-face time during this encounter.   Julaine FusiKaty D Evelynn Hench, NP

## 2018-12-05 NOTE — Progress Notes (Deleted)
Virtual Visit via Telephone Note  I connected with Sheri PaxHeather A Flores on 12/05/18 at 10:30 AM EDT by telephone and verified that I am speaking with the correct person using two identifiers.   I discussed the limitations, risks, security and privacy concerns of performing an evaluation and management service by telephone and the availability of in person appointments. The staff discussed with the patient that there may be a patient responsible charge related to this service. The patient expressed understanding and agreed to proceed.   History of Present Illness: 11/30/2018 OV: Sheri Flores calls in today with concerns about elevated BP She reports experiencing dizziness and lightheadedness two mornings ago that lasted about 15 mins.  She went to her mother in laws home a few hours later and checked her BP- 158/94, repeat BP 148/90 She checked her BP yesterday and it was Yes 160/100, repeat pressure around the same reading. All readings taken in L arm, while sitting. She denies CP/chest pressure She denies dyspnea/cough/N/V/D/diaphoresis  She denies previous elevated BP readings She continues to abstain from tobacco use, but did admit to using vape products intermittently the last few weeks. She has been trying to reduce soda intake and and sodium intake, however had "cube steak for dinner last night". Reviewed usual foods that are high in Na+, which can cause increase in BP She estimates to only consume 1-2 beers per weekend She reports that her mother is treated for HTN  12/07/2018 OV: Sheri Flores calls in today with ambulatory BP readings: SBP: DBP:   Patient Care Team    Relationship Specialty Notifications Start End  Julaine Fusianford, Monifa Blanchette D, NP PCP - General Family Medicine  03/22/18     Patient Active Problem List   Diagnosis Date Noted  . Elevated BP without diagnosis of hypertension 11/30/2018  . 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 GAD (generalized anxiety disorder) 08/29/2018  . Insomnia due to  other mental disorder 08/29/2018  . Hematochezia 08/29/2018  . Bronchitis 08/02/2018  . Acute bilateral low back pain without sciatica 06/06/2018  . Healthcare maintenance 05/09/2018  . Loose stools 05/09/2018  . Abnormal urinalysis 05/09/2018     Past Medical History:  Diagnosis Date  . Anxiety   . Hemorrhoids      Past Surgical History:  Procedure Laterality Date  . TONSILLECTOMY    . TUBAL LIGATION       Family History  Problem Relation Age of Onset  . Breast cancer Paternal Grandmother 6960  . Breast cancer Maternal Grandmother   . Colon cancer Paternal Grandfather   . Rectal cancer Neg Hx   . Stomach cancer Neg Hx   . Esophageal cancer Neg Hx      Social History   Substance and Sexual Activity  Drug Use Not Currently  . Types: Marijuana     Social History   Substance and Sexual Activity  Alcohol Use Yes  . Frequency: Never   Comment: socially     Social History   Tobacco Use  Smoking Status Current Every Day Smoker  . Packs/day: 0.50  . Years: 20.00  . Pack years: 10.00  . Types: Cigarettes  Smokeless Tobacco Never Used     Outpatient Encounter Medications as of 12/07/2018  Medication Sig  . acyclovir cream (ZOVIRAX) 5 % Apply 1 application topically every 5 (five) hours as needed.  Marland Kitchen. albuterol (PROVENTIL HFA;VENTOLIN HFA) 108 (90 Base) MCG/ACT inhaler Inhale 2 puffs into the lungs every 6 (six) hours as needed for wheezing or shortness of  breath.  . clonazePAM (KLONOPIN) 0.5 MG tablet 1/2 to 1 tablet as needed at bedtime  . FLUoxetine (PROZAC) 20 MG tablet 1/2 tablet for one week then increase to one full tablet daily   Facility-Administered Encounter Medications as of 12/07/2018  Medication  . 0.9 %  sodium chloride infusion    Allergies: Hydrocodone and Trazodone and nefazodone  There is no height or weight on file to calculate BMI.  There were no vitals taken for this visit.  Observations/Objective:   Assessment and  Plan:   Follow Up Instructions:    I discussed the assessment and treatment plan with the patient. The patient was provided an opportunity to ask questions and all were answered. The patient agreed with the plan and demonstrated an understanding of the instructions.   The patient was advised to call back or seek an in-person evaluation if the symptoms worsen or if the condition fails to improve as anticipated.  I provided *** minutes of non-face-to-face time during this encounter.   Julaine Fusi, NP

## 2018-12-07 ENCOUNTER — Ambulatory Visit: Payer: BLUE CROSS/BLUE SHIELD | Admitting: Adult Health

## 2019-01-05 ENCOUNTER — Telehealth: Payer: Self-pay | Admitting: Adult Health

## 2019-01-05 ENCOUNTER — Other Ambulatory Visit: Payer: Self-pay

## 2019-01-05 ENCOUNTER — Ambulatory Visit: Payer: BLUE CROSS/BLUE SHIELD

## 2019-01-05 ENCOUNTER — Encounter: Payer: Self-pay | Admitting: Adult Health

## 2019-01-05 ENCOUNTER — Ambulatory Visit (INDEPENDENT_AMBULATORY_CARE_PROVIDER_SITE_OTHER): Payer: BLUE CROSS/BLUE SHIELD | Admitting: Adult Health

## 2019-01-05 VITALS — BP 128/82 | HR 65 | Temp 98.6°F | Ht 64.0 in | Wt 138.9 lb

## 2019-01-05 DIAGNOSIS — M5416 Radiculopathy, lumbar region: Secondary | ICD-10-CM | POA: Diagnosis not present

## 2019-01-05 DIAGNOSIS — M545 Low back pain: Secondary | ICD-10-CM | POA: Diagnosis not present

## 2019-01-05 MED ORDER — CYCLOBENZAPRINE HCL 10 MG PO TABS: 10.0000 mg | ORAL_TABLET | Freq: Three times a day (TID) | ORAL | 0 refills | Status: DC | PRN

## 2019-01-05 MED ORDER — MELOXICAM 7.5 MG PO TABS: 7.5000 mg | ORAL_TABLET | Freq: Every day | ORAL | 0 refills | Status: DC

## 2019-01-05 MED ORDER — PREDNISONE 20 MG PO TABS: ORAL_TABLET | ORAL | 0 refills | Status: DC

## 2019-01-05 MED ORDER — METHYLPREDNISOLONE SODIUM SUCC 125 MG IJ SOLR
125.0000 mg | Freq: Once | INTRAMUSCULAR | Status: AC
  Administered 2019-01-05: 16:00:00 125 mg via INTRAMUSCULAR

## 2019-01-05 NOTE — Patient Instructions (Addendum)
Acute Back Pain, Adult Acute back pain is sudden and usually short-lived. It is often caused by an injury to the muscles and tissues in the back. The injury may result from:  A muscle or ligament getting overstretched or torn (strained). Ligaments are tissues that connect bones to each other. Lifting something improperly can cause a back strain.  Wear and tear (degeneration) of the spinal disks. Spinal disks are circular tissue that provides cushioning between the bones of the spine (vertebrae).  Twisting motions, such as while playing sports or doing yard work.  A hit to the back.  Arthritis. You may have a physical exam, lab tests, and imaging tests to find the cause of your pain. Acute back pain usually goes away with rest and home care. Follow these instructions at home: Managing pain, stiffness, and swelling  Take over-the-counter and prescription medicines only as told by your health care provider.  Your health care provider may recommend applying ice during the first 24-48 hours after your pain starts. To do this: ? Put ice in a plastic bag. ? Place a towel between your skin and the bag. ? Leave the ice on for 20 minutes, 2-3 times a day.  If directed, apply heat to the affected area as often as told by your health care provider. Use the heat source that your health care provider recommends, such as a moist heat pack or a heating pad. ? Place a towel between your skin and the heat source. ? Leave the heat on for 20-30 minutes. ? Remove the heat if your skin turns bright red. This is especially important if you are unable to feel pain, heat, or cold. You have a greater risk of getting burned. Activity   Do not stay in bed. Staying in bed for more than 1-2 days can delay your recovery.  Sit up and stand up straight. Avoid leaning forward when you sit, or hunching over when you stand. ? If you work at a desk, sit close to it so you do not need to lean over. Keep your chin tucked  in. Keep your neck drawn back, and keep your elbows bent at a right angle. Your arms should look like the letter "L." ? Sit high and close to the steering wheel when you drive. Add lower back (lumbar) support to your car seat, if needed.  Take short walks on even surfaces as soon as you are able. Try to increase the length of time you walk each day.  Do not sit, drive, or stand in one place for more than 30 minutes at a time. Sitting or standing for long periods of time can put stress on your back.  Do not drive or use heavy machinery while taking prescription pain medicine.  Use proper lifting techniques. When you bend and lift, use positions that put less stress on your back: ? Bend your knees. ? Keep the load close to your body. ? Avoid twisting.  Exercise regularly as told by your health care provider. Exercising helps your back heal faster and helps prevent back injuries by keeping muscles strong and flexible.  Work with a physical therapist to make a safe exercise program, as recommended by your health care provider. Do any exercises as told by your physical therapist. Lifestyle  Maintain a healthy weight. Extra weight puts stress on your back and makes it difficult to have good posture.  Avoid activities or situations that make you feel anxious or stressed. Stress and anxiety increase muscle   tension and can make back pain worse. Learn ways to manage anxiety and stress, such as through exercise. General instructions  Sleep on a firm mattress in a comfortable position. Try lying on your side with your knees slightly bent. If you lie on your back, put a pillow under your knees.  Follow your treatment plan as told by your health care provider. This may include: ? Cognitive or behavioral therapy. ? Acupuncture or massage therapy. ? Meditation or yoga. Contact a health care provider if:  You have pain that is not relieved with rest or medicine.  You have increasing pain going down  into your legs or buttocks.  Your pain does not improve after 2 weeks.  You have pain at night.  You lose weight without trying.  You have a fever or chills. Get help right away if:  You develop new bowel or bladder control problems.  You have unusual weakness or numbness in your arms or legs.  You develop nausea or vomiting.  You develop abdominal pain.  You feel faint. Summary  Acute back pain is sudden and usually short-lived.  Use proper lifting techniques. When you bend and lift, use positions that put less stress on your back.  Take over-the-counter and prescription medicines and apply heat or ice as directed by your health care provider. This information is not intended to replace advice given to you by your health care provider. Make sure you discuss any questions you have with your health care provider. Document Released: 08/03/2005 Document Revised: 03/10/2018 Document Reviewed: 03/17/2017 Elsevier Interactive Patient Education  2019 [employer].  Start oral Prednisone tomorrow, since you were given steroid injection in clinic today. Please follow care instructions as listed above. Please use Meloxicam and Cyclobenzaprine as needed. Rest, resume activity as tolerated. If symptoms are not improving in 2 weeks, please call clinic. FEEL BETTER!

## 2019-01-05 NOTE — Assessment & Plan Note (Addendum)
DG Lumbar Complete: IMPRESSION: No fracture or spondylolisthesis. No appreciable arthropathic change. Start oral Prednisone tomorrow, since you were given steroid injection  (Solu-Medrol 125mg /85ml IM) in clinic today. Please follow care instructions as listed above. Please use Meloxicam and Cyclobenzaprine as needed. Rest, resume activity as tolerated. If symptoms are not improving in 2 weeks, please call clinic. FEEL BETTER!

## 2019-01-05 NOTE — Telephone Encounter (Signed)
Per William Hamburger, NP requested for Genella Rife to advise pt  that she needs in office visit.  Tiajuana Amass, CMA

## 2019-01-05 NOTE — Progress Notes (Signed)
Subjective:    Patient ID: Sheri Flores, female    DOB: 05/16/1976, 43 y.o.   MRN: [id number]  HPI:  Ms. Sheri Flores presents with significant back pain the started 3 days ago, after lifting heavy boxes (that weighed between 40-50 lbs) repetitively for two days prior to onset of pain. She reports pain is low back, L side worse than R, with radiation down both legs, however she denies numbness/tingling in feet. She denies bowel/bladder dysfunction. She reports constant throbbing pain, rated 8/10. Pain is worse with sitting and lying, improves when standing. She has taken OTC Acetaminophen without sx relief, last dose was bedtime last night. She denies hx of back injury, however has had back pain in the past  CMP 04/2018- GFR 77, LFTs-normal Patient Care Team    Relationship Specialty Notifications Start End  Julaine Fusianford, Lesle Faron D, NP PCP - General Family Medicine  03/22/18     Patient Active Problem List   Diagnosis Date Noted  . Lumbar back pain with radiculopathy affecting lower extremity 01/05/2019  . Elevated BP without diagnosis of hypertension 11/30/2018  . 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 GAD (generalized anxiety disorder) 08/29/2018  . 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 Hematochezia 08/29/2018  . Bronchitis 08/02/2018  . Acute bilateral low back pain without sciatica 06/06/2018  . Healthcare maintenance 05/09/2018  . Loose stools 05/09/2018  . Abnormal urinalysis 05/09/2018     Past Medical History:  Diagnosis Date  . Anxiety   . Hemorrhoids      Past Surgical History:  Procedure Laterality Date  . TONSILLECTOMY    . TUBAL LIGATION       Family History  Problem Relation Age of Onset  . Breast cancer Paternal Grandmother 4060  . Breast cancer Maternal Grandmother   . Colon cancer Paternal Grandfather   . Rectal cancer Neg Hx   . Stomach cancer Neg Hx   . Esophageal cancer Neg Hx      Social History   Substance and Sexual Activity  Drug Use Not  Currently  . Types: Marijuana     Social History   Substance and Sexual Activity  Alcohol Use Yes  . Frequency: Never   Comment: socially     Social History   Tobacco Use  Smoking Status Current Every Day Smoker  . Packs/day: 0.50  . Years: 20.00  . Pack years: 10.00  . Types: Cigarettes  Smokeless Tobacco Never Used     Outpatient Encounter Medications as of 01/05/2019  Medication Sig  . albuterol (PROVENTIL HFA;VENTOLIN HFA) 108 (90 Base) MCG/ACT inhaler Inhale 2 puffs into the lungs every 6 (six) hours as needed for wheezing or shortness of breath.  . cetirizine (ZYRTEC) 10 MG tablet Take 10 mg by mouth daily.  . clonazePAM (KLONOPIN) 0.5 MG tablet 1/2 to 1 tablet as needed at bedtime  . FLUoxetine (PROZAC) 20 MG tablet 1/2 tablet for one week then increase to one full tablet daily  . cyclobenzaprine (FLEXERIL) 10 MG tablet Take 1 tablet (10 mg total) by mouth 3 (three) times daily as needed for muscle spasms.  . meloxicam (MOBIC) 7.5 MG tablet Take 1 tablet (7.5 mg total) by mouth daily.  . predniSONE (DELTASONE) 20 MG tablet 1 tab every 12 hrs for 3 days, then 1 tab daily for 3 days, start 01/06/19  . [DISCONTINUED] acyclovir cream (ZOVIRAX) 5 % Apply 1 application topically every 5 (five) hours as needed.   Facility-Administered Encounter Medications as of 01/05/2019  Medication  . 0.9 %  sodium chloride infusion  . [COMPLETED] methylPREDNISolone sodium succinate (SOLU-MEDROL) 125 mg/2 mL injection 125 mg    Allergies: Hydrocodone and Trazodone and nefazodone  Body mass index is 23.84 kg/m.  Blood pressure 128/82, pulse 65, temperature 98.6 F (37 C), temperature source Oral, height 5\' 4"  (1.626 m), weight 138 lb 14.4 oz (63 kg), SpO2 100 %.  Review of Systems  Constitutional: Positive for activity change and fatigue. Negative for appetite change, chills, diaphoresis, fever and unexpected weight change.  Eyes: Negative for visual disturbance.  Respiratory:  Negative for cough, chest tightness, shortness of breath, wheezing and stridor.   Cardiovascular: Negative for chest pain, palpitations and leg swelling.  Gastrointestinal: Negative for abdominal distention, abdominal pain, blood in stool, constipation, diarrhea, nausea and vomiting.  Endocrine: Negative for cold intolerance, heat intolerance, polydipsia, polyphagia and polyuria.  Genitourinary: Negative for difficulty urinating and flank pain.  Musculoskeletal: Positive for arthralgias, back pain, gait problem and myalgias.  Skin: Negative for color change, pallor, rash and wound.  Hematological: Does not bruise/bleed easily.  Psychiatric/Behavioral: Positive for dysphoric mood and sleep disturbance. The patient is nervous/anxious.        Objective:   Physical Exam Constitutional:      General: She is in acute distress.     Appearance: She is normal weight. She is not ill-appearing, toxic-appearing or diaphoretic.  HENT:     Head: Normocephalic and atraumatic.  Musculoskeletal:        General: Swelling and tenderness present. No signs of injury.     Cervical back: Normal.     Thoracic back: Normal.     Lumbar back: She exhibits decreased range of motion, tenderness, swelling, pain and spasm. She exhibits no bony tenderness.     Comments: Lumbar back: Tenderness with palpation lumbar back L side > R side Spasm noted R lumbar back Limited flexion/extension ROM Very uncomfortable when laterally bending to the L Positive bil straight leg raise  Skin:    General: Skin is warm and dry.     Capillary Refill: Capillary refill takes less than 2 seconds.  Neurological:     Mental Status: She is alert and oriented to person, place, and time.  Psychiatric:        Mood and Affect: Mood normal.        Behavior: Behavior normal.        Thought Content: Thought content normal.        Judgment: Judgment normal.       Assessment & Plan:   1. Lumbar back pain with radiculopathy affecting  lower extremity     Lumbar back pain with radiculopathy affecting lower extremity DG Lumbar Complete: IMPRESSION: No fracture or spondylolisthesis. No appreciable arthropathic change. Start oral Prednisone tomorrow, since you were given steroid injection  (Solu-Medrol 125mg /32ml IM) in clinic today. Please follow care instructions as listed above. Please use Meloxicam and Cyclobenzaprine as needed. Rest, resume activity as tolerated. If symptoms are not improving in 2 weeks, please call clinic. FEEL BETTER!    FOLLOW-UP:  Return if symptoms worsen or fail to improve.

## 2019-01-05 NOTE — Telephone Encounter (Signed)
Patient called states having severe lower back pain & ask if she needs an OV or TELEHEALTH----  --FORWARDING message to medical assistant to call pt @ [phone] & make determination if she is coming to Office  Or if Telehealth required  --Tentatively place pt in [facility] 3:15 Acute slot for today.  --glh

## 2019-01-13 ENCOUNTER — Ambulatory Visit
Admission: RE | Admit: 2019-01-13 | Discharge: 2019-01-13 | Disposition: A | Discharge status: Home / Self Care | Payer: BLUE CROSS/BLUE SHIELD | Source: Ambulatory Visit | Attending: Adult Health | Admitting: Adult Health

## 2019-01-13 ENCOUNTER — Other Ambulatory Visit: Payer: Self-pay

## 2019-01-13 DIAGNOSIS — R921 Mammographic calcification found on diagnostic imaging of breast: Secondary | ICD-10-CM

## 2019-05-09 ENCOUNTER — Encounter: Payer: Self-pay | Admitting: Gynecology

## 2019-05-11 ENCOUNTER — Telehealth: Payer: Self-pay | Admitting: Adult Health

## 2019-05-11 ENCOUNTER — Encounter: Payer: Self-pay | Admitting: Adult Health

## 2019-05-11 NOTE — Telephone Encounter (Signed)
MyChart message responded to.  Charyl Bigger, CMA

## 2019-05-11 NOTE — Telephone Encounter (Signed)
Patient called request to speak w/ provider / nurse regarding her message sent thru MyChart---- advised pt of Provider appts availability for Monday (both provider fully booked for today /afternoon) but she says can't wait that long  (suggest she go to [facility] or ED for medical attn.)  --Forwarding message to medical assistant/ provider as fyi & after they review Images on mychart to contact the patient w/ any advice / care instructions.  --glh

## 2019-05-15 ENCOUNTER — Encounter: Payer: Self-pay | Admitting: Adult Health

## 2019-05-18 ENCOUNTER — Other Ambulatory Visit: Payer: Self-pay | Admitting: Adult Health

## 2019-05-18 ENCOUNTER — Other Ambulatory Visit: Payer: Self-pay

## 2019-05-18 ENCOUNTER — Encounter: Payer: Self-pay | Admitting: Adult Health

## 2019-05-18 ENCOUNTER — Ambulatory Visit (INDEPENDENT_AMBULATORY_CARE_PROVIDER_SITE_OTHER): Payer: 59 | Admitting: Adult Health

## 2019-05-18 DIAGNOSIS — R21 Rash and other nonspecific skin eruption: Secondary | ICD-10-CM | POA: Diagnosis not present

## 2019-05-18 MED ORDER — PREDNISONE 10 MG (21) PO TBPK: ORAL_TABLET | ORAL | 0 refills | Status: DC

## 2019-05-18 NOTE — Progress Notes (Signed)
Virtual Visit via Telephone Note  I connected with Sheri Flores on 05/18/19 at  3:00 PM EDT by telephone and verified that I am speaking with the correct person using two identifiers.  Location: Patient: Home Provider:In Clinic   I discussed the limitations, risks, security and privacy concerns of performing an evaluation and management service by telephone and the availability of in person appointments. I also discussed with the patient that there may be a patient responsible charge related to this service. The patient expressed understanding and agreed to proceed.   History of Present Illness: Sheri Flores calls with diffuse rash- Started on face-now resolved on face, but has spread to abdomen, underneath breasts, bil inner thighs.  She denies open tissue, drainage, or active infection. She reports intense itching, redness, with warmth. Sx's began 05/13/2019- 5 days ago. OTC remedies- OTC Bendaryl cream , OTC Hydrocortisone  cream, OTC Benadryl  She denies changing detergents, uses [employer] Recent travel to [facility] (11-13 Sept) , stayed in camper with clean sheets. She denies tick bites. She denies eating anything unusual lately. She denies N/V/D/C. She denies this ever occurring previously. She  Patient Care Team    Relationship Specialty Notifications Start End  Julaine Fusi, NP PCP - General Family Medicine  03/22/18     Patient Active Problem List   Diagnosis Date Noted  . Lumbar back pain with radiculopathy affecting lower extremity 01/05/2019  . Elevated BP without diagnosis of hypertension 11/30/2018  . 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 GAD (generalized anxiety disorder) 08/29/2018  . 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 Hematochezia 08/29/2018  . Bronchitis 08/02/2018  . Acute bilateral low back pain without sciatica 06/06/2018  . Healthcare maintenance 05/09/2018  . Loose stools 05/09/2018  . Abnormal urinalysis 05/09/2018     Past Medical  History:  Diagnosis Date  . Anxiety   . Hemorrhoids      Past Surgical History:  Procedure Laterality Date  . TONSILLECTOMY    . TUBAL LIGATION       Family History  Problem Relation Age of Onset  . Breast cancer Paternal Grandmother 55  . Breast cancer Maternal Grandmother   . Colon cancer Paternal Grandfather   . Rectal cancer Neg Hx   . Stomach cancer Neg Hx   . Esophageal cancer Neg Hx      Social History   Substance and Sexual Activity  Drug Use Not Currently  . Types: Marijuana     Social History   Substance and Sexual Activity  Alcohol Use Yes  . Frequency: Never   Comment: socially     Social History   Tobacco Use  Smoking Status Current Every Day Smoker  . Packs/day: 0.50  . Years: 20.00  . Pack years: 10.00  . Types: Cigarettes  Smokeless Tobacco Never Used     Outpatient Encounter Medications as of 05/18/2019  Medication Sig  . albuterol (PROVENTIL HFA;VENTOLIN HFA) 108 (90 Base) MCG/ACT inhaler Inhale 2 puffs into the lungs every 6 (six) hours as needed for wheezing or shortness of breath.  . [DISCONTINUED] cetirizine (ZYRTEC) 10 MG tablet Take 10 mg by mouth daily.  . [DISCONTINUED] clonazePAM (KLONOPIN) 0.5 MG tablet 1/2 to 1 tablet as needed at bedtime  . [DISCONTINUED] cyclobenzaprine (FLEXERIL) 10 MG tablet Take 1 tablet (10 mg total) by mouth 3 (three) times daily as needed for muscle spasms.  . [DISCONTINUED] FLUoxetine (PROZAC) 20 MG tablet 1/2 tablet for one week then increase to  one full tablet daily  . [DISCONTINUED] meloxicam (MOBIC) 7.5 MG tablet Take 1 tablet (7.5 mg total) by mouth daily.  . [DISCONTINUED] predniSONE (DELTASONE) 20 MG tablet 1 tab every 12 hrs for 3 days, then 1 tab daily for 3 days, start 01/06/19   Facility-Administered Encounter Medications as of 05/18/2019  Medication  . 0.9 %  sodium chloride infusion    Allergies: Hydrocodone and Trazodone and nefazodone  There is no height or weight on file to  calculate BMI.  Blood pressure (!) 144/83, pulse (!) 107, temperature (!) 96.9 F (36.1 C), temperature source Oral, last menstrual period 05/11/2019. Review of Systems: General:   Denies fever, chills, unexplained weight loss.  Optho/Auditory:   Denies visual changes, blurred vision/LOV Respiratory:   Denies SOB, DOE more than baseline levels.  Cardiovascular:   Denies chest pain, palpitations, new onset peripheral edema  Gastrointestinal:   Denies nausea, vomiting, diarrhea.  Genitourinary: Denies dysuria, freq/ urgency, flank pain or discharge from genitals.  Endocrine:     Denies hot or cold intolerance, polyuria, polydipsia. Musculoskeletal:   Denies unexplained myalgias, joint swelling, unexplained arthralgias, gait problems.  Skin:  Denies suspicious lesions, Rash +, Itching +, Swelling + Neurological:     Denies dizziness, unexplained weakness, numbness  Psychiatric/Behavioral:   Denies mood changes, suicidal or homicidal ideations, hallucinations This patient does not have sx concerning for COVID-19 Infection (ie; fever, chills, cough, new or worsening shortness of breath).   Observations/Objective: No acute distress noted during the telephone conversation. Reviewed pictures sent via MyChart  Assessment and Plan: Prednisone Dose Pak- she has tolerated Prednisone in past. Increase water. OTC non-sedating antihistamine in am and OTC Benadryl QHS- PRN Continue with OTC Hydrocortisone cream as needed. Cold compresses to various sites. If rash not improved after completing dose pak, schedule in office f/u. Continue to social distance and wear a mask when in public.  Follow Up Instructions: PRN   I discussed the assessment and treatment plan with the patient. The patient was provided an opportunity to ask questions and all were answered. The patient agreed with the plan and demonstrated an understanding of the instructions.   The patient was advised to call back or seek an  in-person evaluation if the symptoms worsen or if the condition fails to improve as anticipated.  I provided 14 minutes of non-face-to-face time during this encounter.   Esaw Grandchild, NP

## 2019-05-19 DIAGNOSIS — R21 Rash and other nonspecific skin eruption: Secondary | ICD-10-CM | POA: Insufficient documentation

## 2019-05-19 NOTE — Assessment & Plan Note (Signed)
Assessment and Plan: Prednisone Dose Pak- she has tolerated Prednisone in past. Increase water. OTC non-sedating antihistamine in am and OTC Benadryl QHS- PRN Continue with OTC Hydrocortisone cream as needed. Cold compresses to various sites. If rash not improved after completing dose pak, schedule in office f/u. Continue to social distance and wear a mask when in public.  Follow Up Instructions: PRN   I discussed the assessment and treatment plan with the patient. The patient was provided an opportunity to ask questions and all were answered. The patient agreed with the plan and demonstrated an understanding of the instructions.   The patient was advised to call back or seek an in-person evaluation if the symptoms worsen or if the condition fails to improve as anticipated.

## 2019-05-22 ENCOUNTER — Other Ambulatory Visit: Payer: BLUE CROSS/BLUE SHIELD

## 2019-05-29 ENCOUNTER — Encounter: Payer: BLUE CROSS/BLUE SHIELD | Admitting: Adult Health

## 2019-08-29 ENCOUNTER — Other Ambulatory Visit: Payer: Self-pay

## 2019-08-29 ENCOUNTER — Encounter: Payer: Self-pay | Admitting: Adult Health

## 2019-08-29 ENCOUNTER — Ambulatory Visit (INDEPENDENT_AMBULATORY_CARE_PROVIDER_SITE_OTHER): Payer: 59 | Admitting: Adult Health

## 2019-08-29 VITALS — BP 117/67 | Temp 96.9°F

## 2019-08-29 DIAGNOSIS — F411 Generalized anxiety disorder: Secondary | ICD-10-CM | POA: Diagnosis not present

## 2019-08-29 DIAGNOSIS — Z0000 Encounter for general adult medical examination without abnormal findings: Secondary | ICD-10-CM

## 2019-08-29 DIAGNOSIS — R41840 Attention and concentration deficit: Secondary | ICD-10-CM

## 2019-08-29 NOTE — Assessment & Plan Note (Signed)
Assessment and Plan: BH/pyshciatry referral placed. Increase daily exercise. Continue to abstain from tobacco use- great job! Recommend CPE with fasting labs March 2021 Continue to social distance and wear a mask when in public.  Follow Up Instructions: Recommend CPE with fasting labs March 2021   I discussed the assessment and treatment plan with the patient. The patient was provided an opportunity to ask questions and all were answered. The patient agreed with the plan and demonstrated an understanding of the instructions.   The patient was advised to call back or seek an in-person evaluation if the symptoms worsen or if the condition fails to improve as anticipated.

## 2019-08-29 NOTE — Assessment & Plan Note (Signed)
Hx of GAD- PDMP reviewed-clonazepam 0.5mg  provided April and May 2020, 20 count each She reports resolution of anxiety and is really enjoying working.

## 2019-08-29 NOTE — Assessment & Plan Note (Signed)
Assessment and Plan: BH/pyshciatry referral placed. Increase daily exercise. Continue to abstain from tobacco use- great job! Recommend CPE with fasting labs March 2021 Continue to social distance and wear a mask when in public.  Follow Up Instructions: Recommend CPE with fasting labs March 2021   I discussed the assessment and treatment plan with the patient. The patient was provided an opportunity to ask questions and all were answered. The patient agreed with the plan and demonstrated an understanding of the instructions.   The patient was advised to call back or seek an in-person evaluation if the symptoms worsen or if the condition fails to improve as anticipated. 

## 2019-08-29 NOTE — Progress Notes (Signed)
Virtual Visit via Telephone Note  I connected with Sheri Flores on 08/29/19 at  9:30 AM EST by telephone and verified that I am speaking with the correct person using two identifiers.  Location: Patient: Home Provider: In Clinic   I discussed the limitations, risks, security and privacy concerns of performing an evaluation and management service by telephone and the availability of in person appointments. I also discussed with the patient that there may be a patient responsible charge related to this service. The patient expressed understanding and agreed to proceed.   History of Present Illness: Sheri Flores calls in concerns about poor concentration and focus. Per pt she was dx'd in childhood with ADHD, however she was never medicated. She reports difficulty with distraction, "I can't sit still, I can't sleep b/c my mind is running a mile a minute". She denies any caffeine use. 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 She is working 30-35 hrs/week. She finds it difficult with the following tasks- 1) counting money 2) staying still 3) staying on topic in conversations She believes that these sx's Fall 2020  Hx of GAD- PDMP reviewed-clonazepam 0.5mg  provided April and May 2020, 20 count each She reports resolution of anxiety and is really enjoying working.  For regular exercise- daily walking  She is agreeable to BH/Pyshciatry referral Patient Care Team    Relationship Specialty Notifications Start End  Julaine Fusi, NP PCP - General Family Medicine  03/22/18     Patient Active Problem List   Diagnosis Date Noted  . Poor concentration 08/29/2019  . Rash 05/19/2019  . Lumbar back pain with radiculopathy affecting lower extremity 01/05/2019  . Elevated BP without diagnosis of hypertension 11/30/2018  . 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 GAD (generalized anxiety disorder) 08/29/2018  .  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 Hematochezia 08/29/2018  . Bronchitis 08/02/2018  . Acute bilateral low back pain without sciatica 06/06/2018  . Healthcare maintenance 05/09/2018  . Loose stools 05/09/2018  . Abnormal urinalysis 05/09/2018     Past Medical History:  Diagnosis Date  . Anxiety   . Hemorrhoids      Past Surgical History:  Procedure Laterality Date  . TONSILLECTOMY    . TUBAL LIGATION       Family History  Problem Relation Age of Onset  . Breast cancer Paternal Grandmother 30  . Breast cancer Maternal Grandmother   . Colon cancer Paternal Grandfather   . Rectal cancer Neg Hx   . Stomach cancer Neg Hx   . Esophageal cancer Neg Hx      Social History   Substance and Sexual Activity  Drug Use Not Currently  . Types: Marijuana     Social History   Substance and Sexual Activity  Alcohol Use Yes   Comment: socially     Social History   Tobacco Use  Smoking Status Current Every Day Smoker  . Packs/day: 0.50  . Years: 20.00  . Pack years: 10.00  . Types: Cigarettes  Smokeless Tobacco Never Used     Outpatient Encounter Medications as of 08/29/2019  Medication Sig  . VENTOLIN HFA 108 (90 Base) MCG/ACT inhaler INHALE 2 PUFFS INTO THE LUNGS EVERY 6 HOURS AS NEEDED FOR WHEEZING OR SHORTNESS OF BREATH.  . [DISCONTINUED] predniSONE (STERAPRED UNI-PAK 21 TAB) 10 MG (21) TBPK tablet Take per pack instructions.   Facility-Administered Encounter Medications as of 08/29/2019  Medication  . 0.9 %  sodium chloride infusion    Allergies: Hydrocodone and Trazodone and nefazodone  There is no height or weight on file to calculate BMI.  Blood pressure 117/67, temperature (!) 96.9 F (36.1 C), temperature source Oral, last menstrual period 08/07/2019. Review of Systems: General:   Denies fever, chills, unexplained weight loss.  Optho/Auditory:   Denies visual changes, blurred vision/LOV Respiratory:   Denies SOB, DOE more than  baseline levels.  Cardiovascular:   Denies chest pain, palpitations, new onset peripheral edema  Gastrointestinal:   Denies nausea, vomiting, diarrhea.  Genitourinary: Denies dysuria, freq/ urgency, flank pain or discharge from genitals.  Endocrine:     Denies hot or cold intolerance, polyuria, polydipsia. Musculoskeletal:   Denies unexplained myalgias, joint swelling, unexplained arthralgias, gait problems.  Skin:  Denies rash, suspicious lesions Neurological:     Denies dizziness, unexplained weakness, numbness  Psychiatric/Behavioral:   Denies mood changes, suicidal or homicidal ideations, hallucinations Poor concentration+ Poor focus +  Observations/Objective: No acute distress noted during telephone conversation  Assessment and Plan: BH/pyshciatry referral placed. Increase daily exercise. Continue to abstain from tobacco use- great job! Recommend CPE with fasting labs March 2021 Continue to social distance and wear a mask when in public.  Follow Up Instructions: Recommend CPE with fasting labs March 2021   I discussed the assessment and treatment plan with the patient. The patient was provided an opportunity to ask questions and all were answered. The patient agreed with the plan and demonstrated an understanding of the instructions.   The patient was advised to call back or seek an in-person evaluation if the symptoms worsen or if the condition fails to improve as anticipated.  I provided 8 minutes of non-face-to-face time during this encounter.   Esaw Grandchild, NP

## 2019-09-11 ENCOUNTER — Telehealth: Payer: Self-pay | Admitting: Adult Health

## 2019-09-11 NOTE — Telephone Encounter (Signed)
Patient called states she has NOT been contacted yet by [facility] provider office--- I am unable to view any BH information(no clearance)   Forwarding message to [facility] E/ Referral Coord.for review & to contact pt.  glh

## 2019-09-25 ENCOUNTER — Ambulatory Visit (INDEPENDENT_AMBULATORY_CARE_PROVIDER_SITE_OTHER): Payer: 59 | Admitting: Clinical

## 2019-09-25 ENCOUNTER — Other Ambulatory Visit: Payer: Self-pay

## 2019-09-25 DIAGNOSIS — F331 Major depressive disorder, recurrent, moderate: Secondary | ICD-10-CM

## 2019-09-25 DIAGNOSIS — F419 Anxiety disorder, unspecified: Secondary | ICD-10-CM | POA: Diagnosis not present
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
## 2020-02-11 IMAGING — MG DIGITAL SCREENING BILATERAL MAMMOGRAM WITH TOMO AND CAD
8 series · 8 of 24 positions shown · non-contrast
Comparison: None

CLINICAL DATA: Screening.

EXAM:
DIGITAL SCREENING BILATERAL MAMMOGRAM WITH TOMO AND CAD

[L MLO synth-2D]
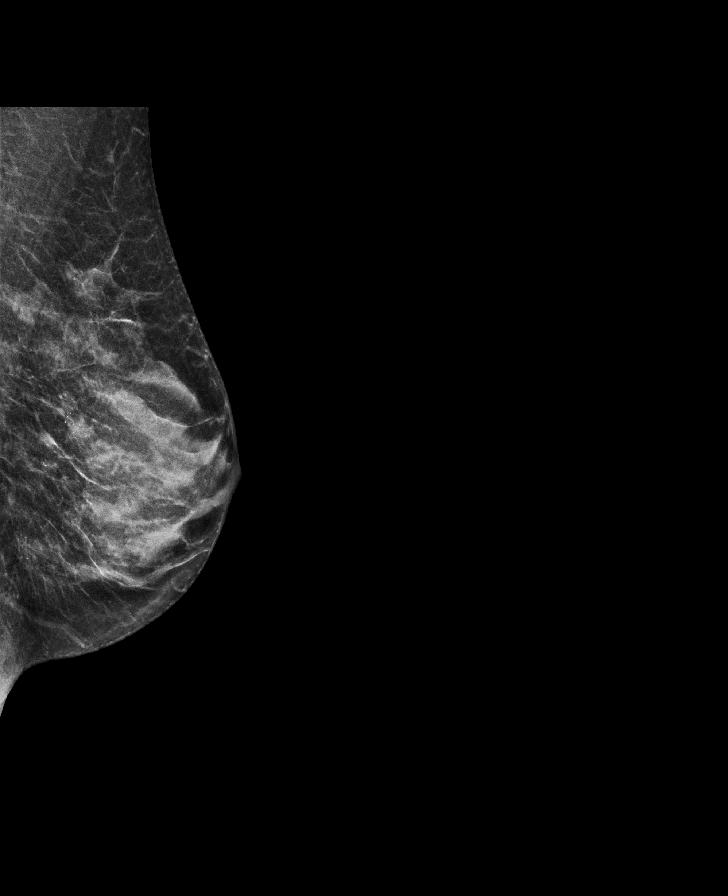

[R CC synth-2D]
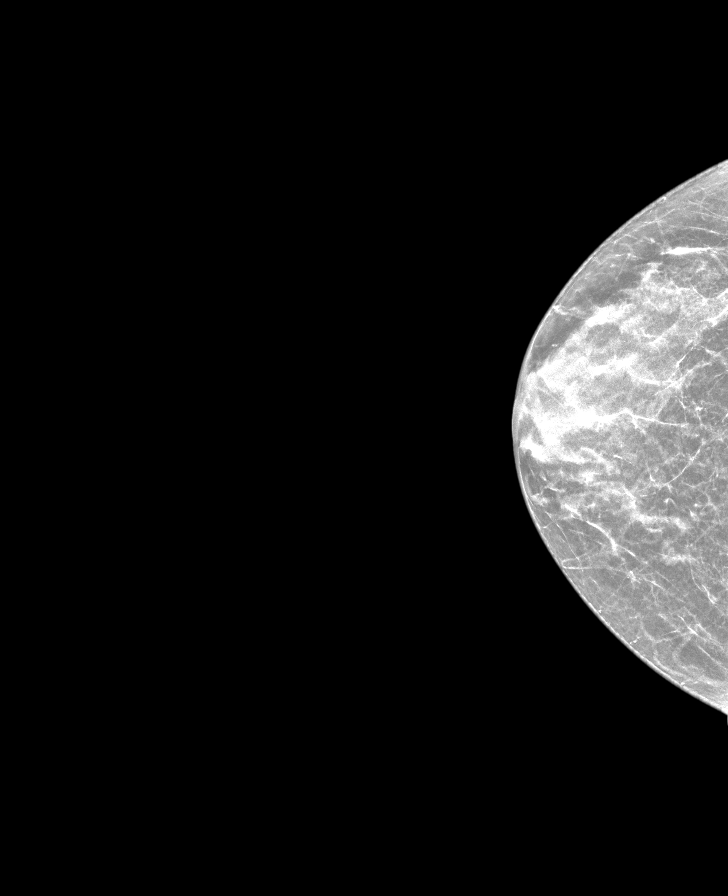

[L CC synth-2D]
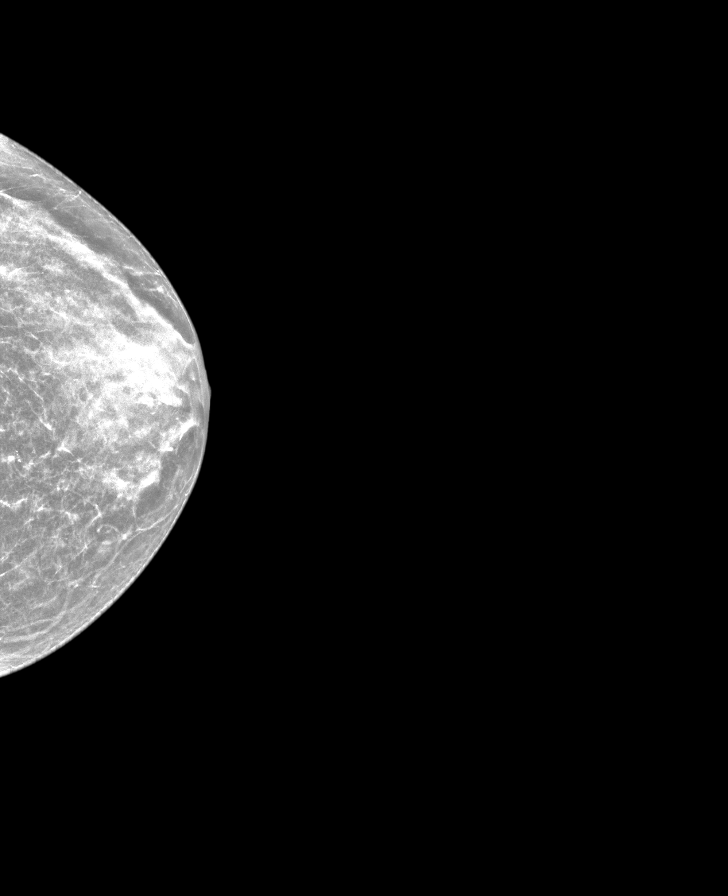

[R MLO synth-2D]
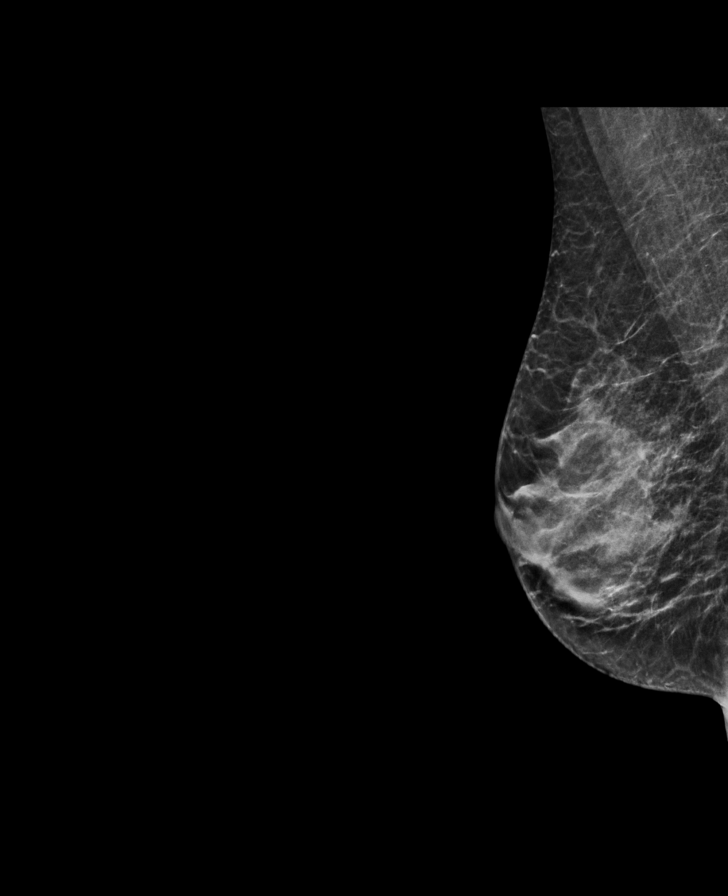

[L CC tomo · tomo slice 23/46.0]
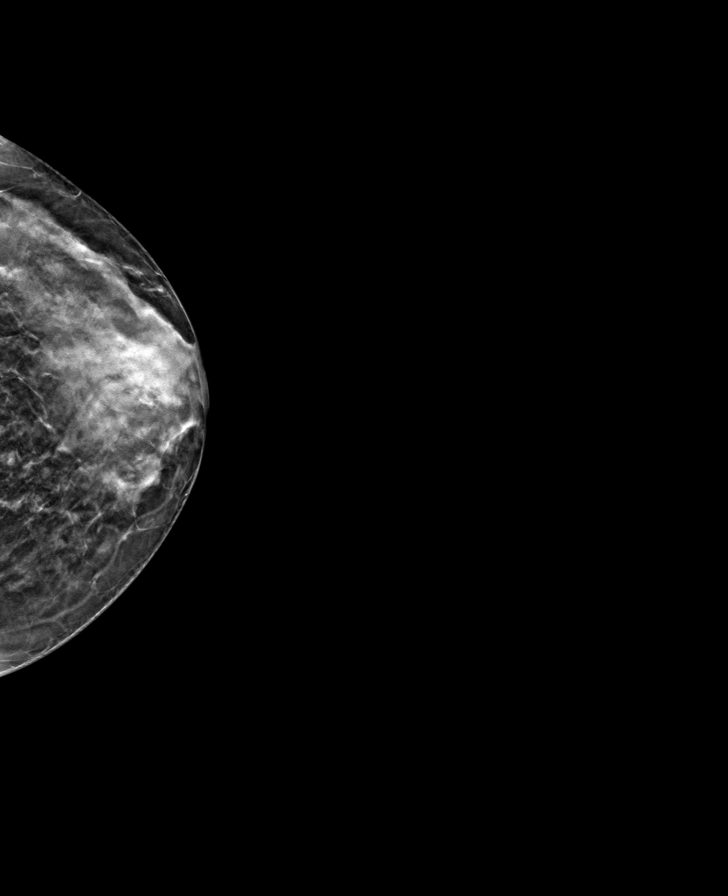

[R MLO tomo · tomo slice 27/53.0]
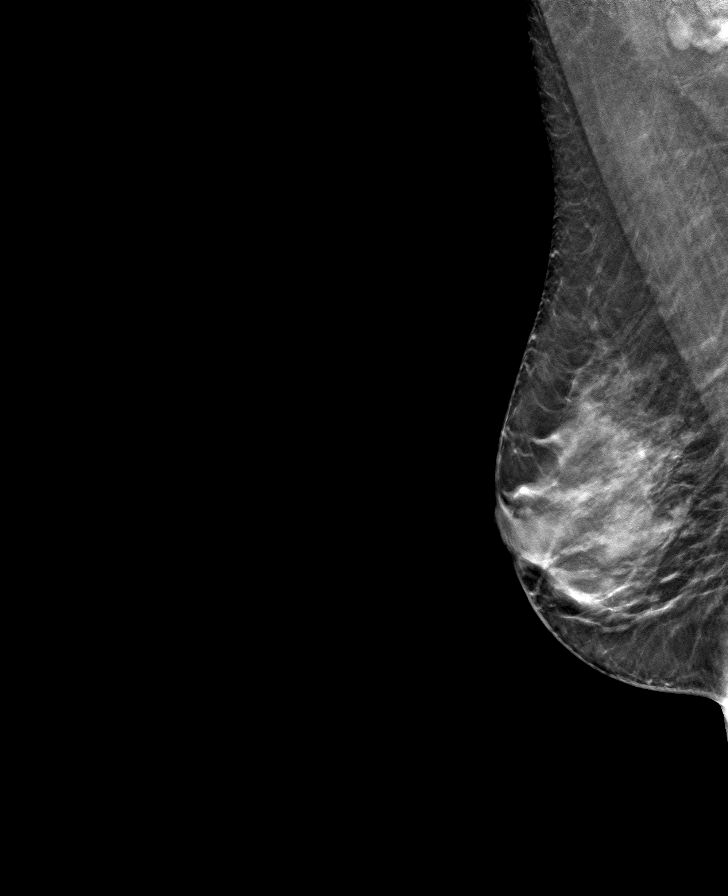

[R CC tomo · tomo slice 22/43.0]
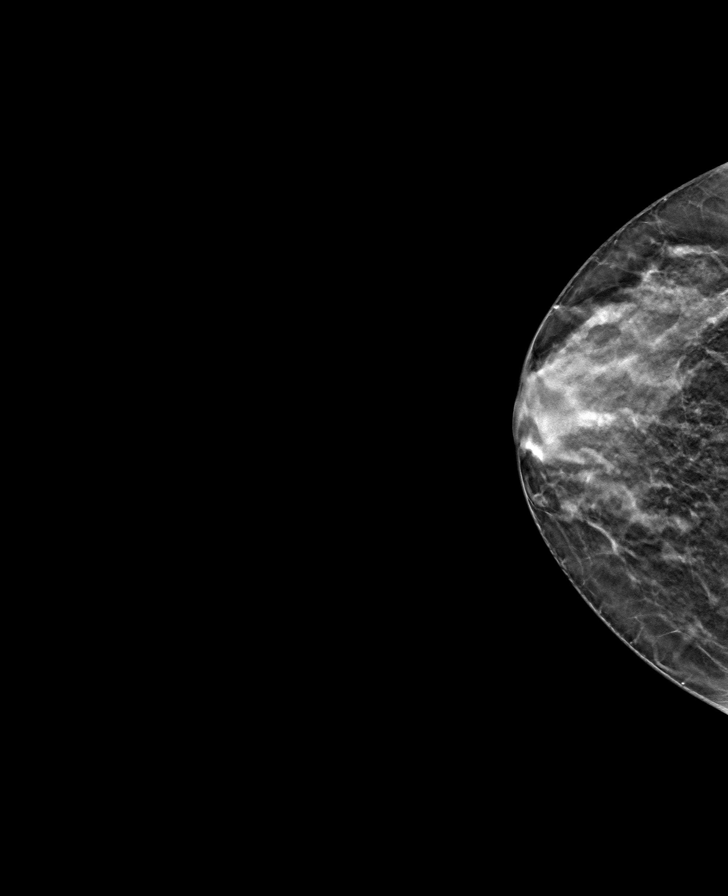

[L MLO tomo · tomo slice 28/55.0]
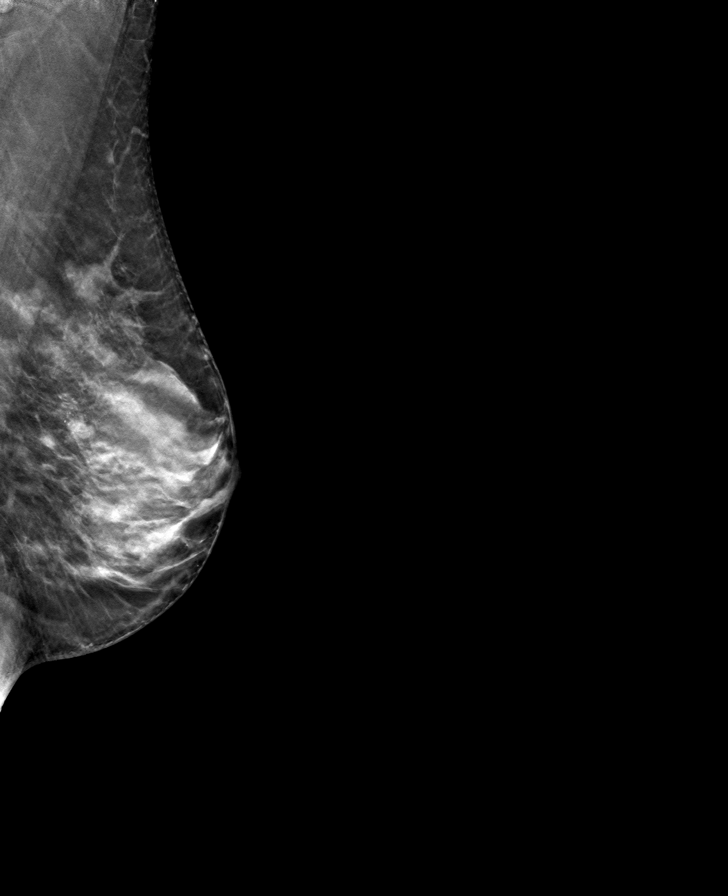

[8 of 24 positions shown; findings below may reference images not displayed]

ACR Breast Density Category c: The breast tissue is heterogeneously
dense, which may obscure small masses.
FINDINGS: In the left breast, a possible asymmetry and left breast
calcifications warrant further evaluation. In the right breast, no
findings suspicious for malignancy. Images were processed with CAD.
IMPRESSION: Further evaluation is suggested for possible asymmetry and
calcifications in the left breast.

RECOMMENDATION:
Diagnostic mammogram and possibly ultrasound of the left breast.
(Code:K0-J-MMS)

The patient will be contacted regarding the findings, and additional
imaging will be scheduled.

BI-RADS CATEGORY  0: Incomplete. Need additional imaging evaluation
and/or prior mammograms for comparison.

## 2020-02-22 IMAGING — US ULTRASOUND LEFT BREAST LIMITED
1 series · 12 of 12 positions shown · non-contrast
Comparison: Previous exam(s).

CLINICAL DATA: Patient recalled from screening for left breast
asymmetry and calcifications.

EXAM:
DIGITAL DIAGNOSTIC LEFT MAMMOGRAM WITH CAD AND TOMO
ULTRASOUND LEFT BREAST

[Series 1: ultrasound left breast limited · 0.04mm/px · 12 of 12 slices shown]
[im 1/12]
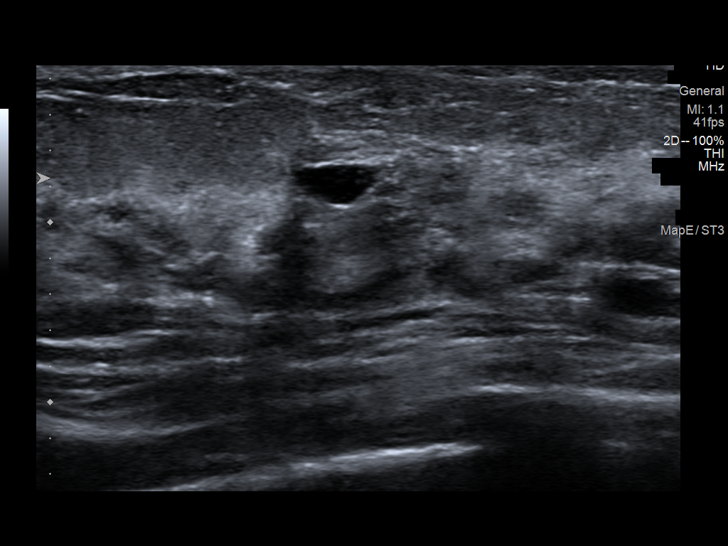
[im 2/12]
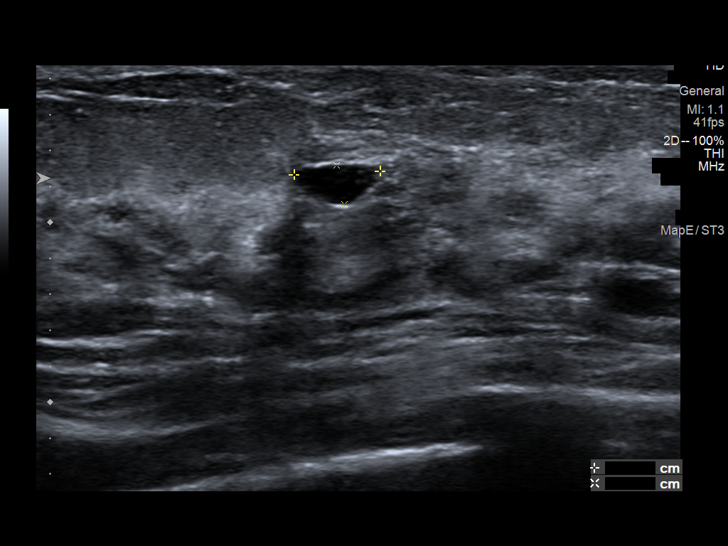
[im 3/12]
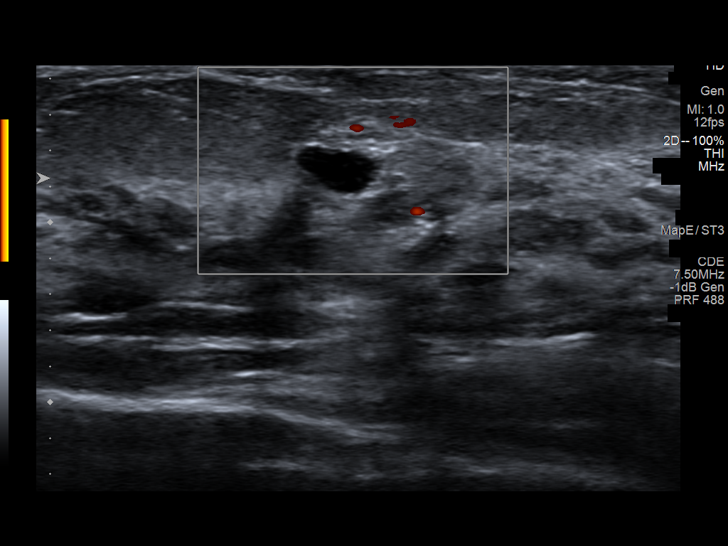
[im 4/12]
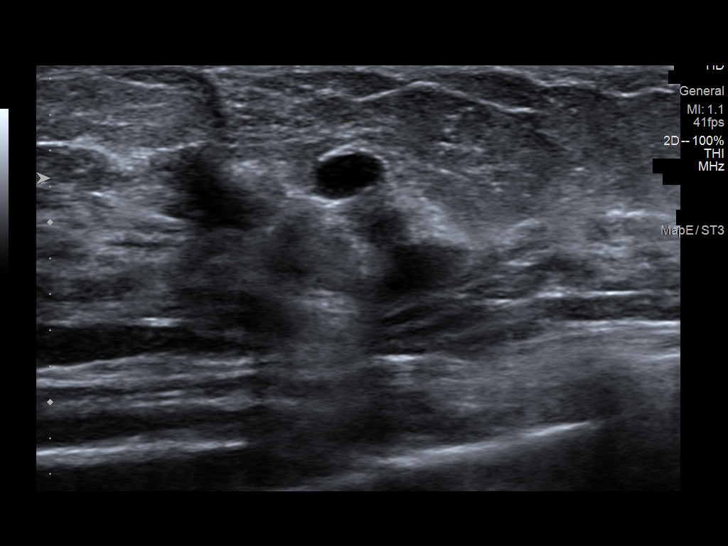
[im 5/12]
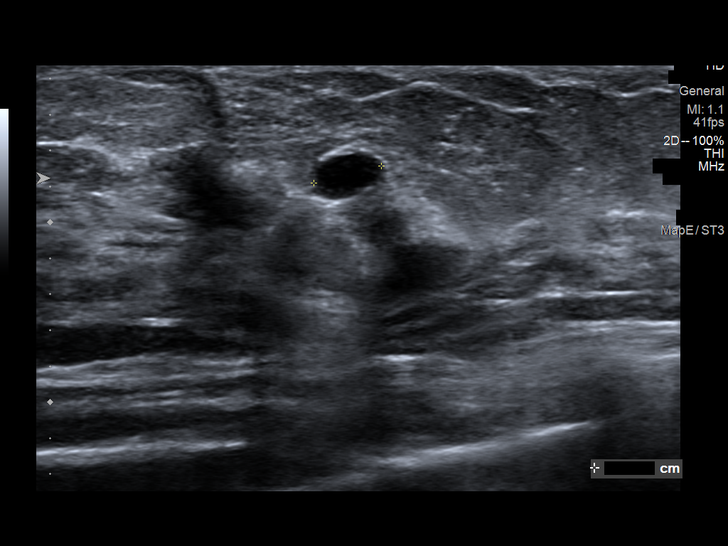
[im 6/12]
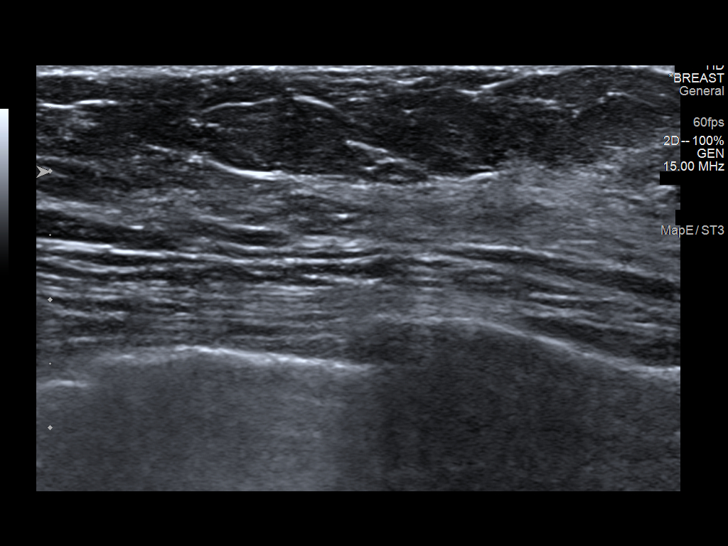
[im 7/12]
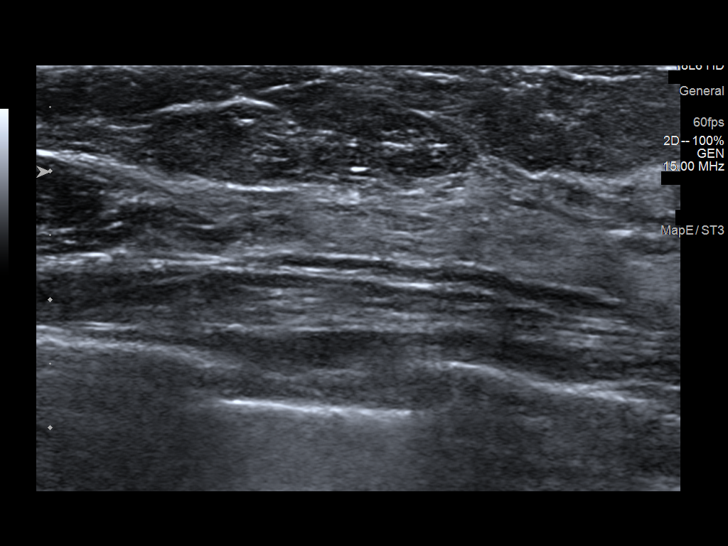
[im 8/12]
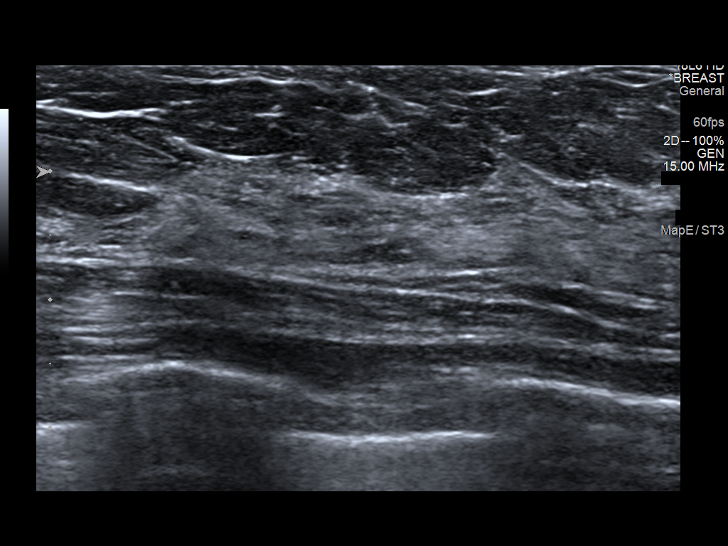
[im 9/12]
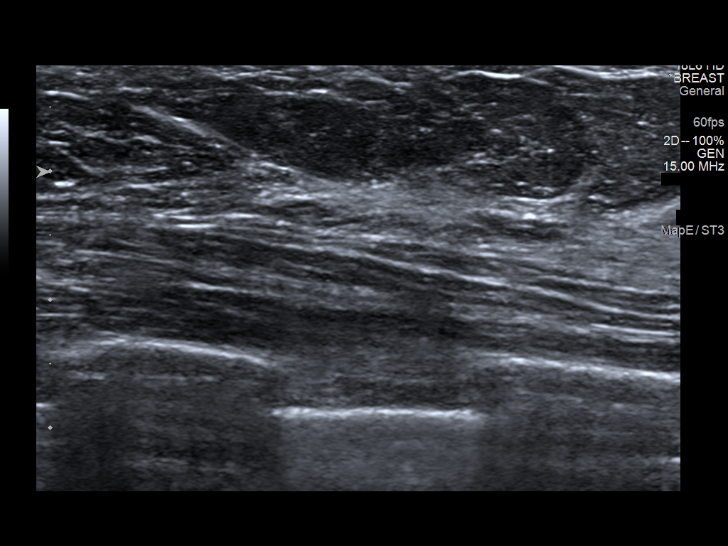
[im 10/12]
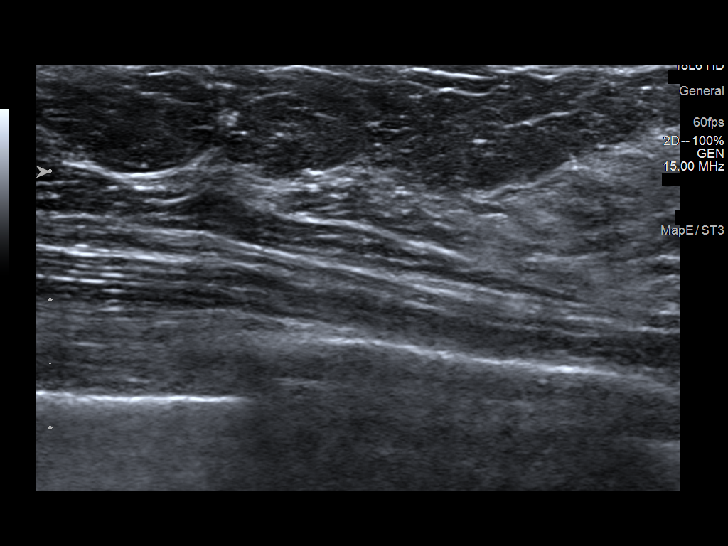
[im 11/12]
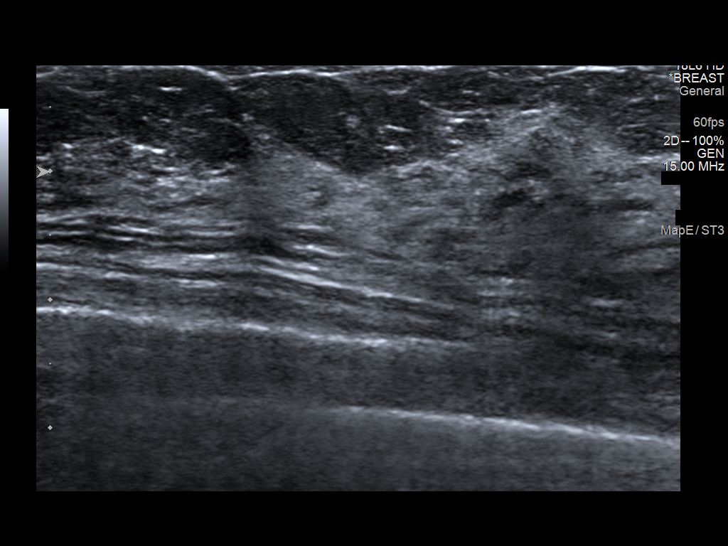
[im 12/12]
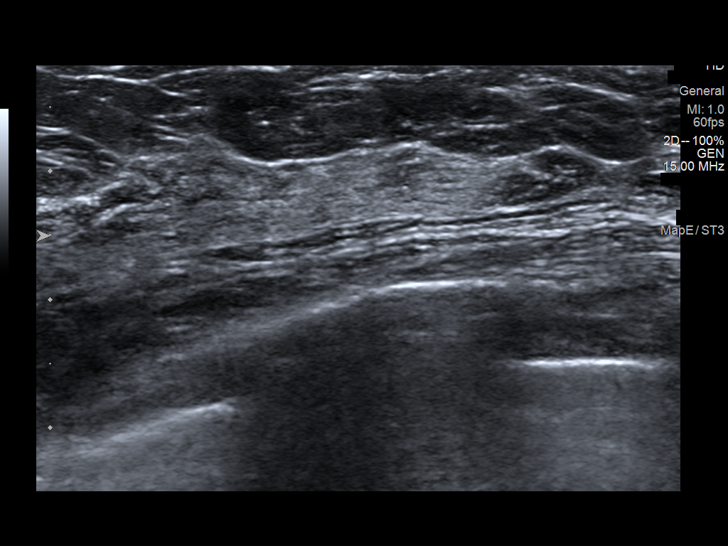

[12 of 12 positions shown; findings below may reference images not displayed]

ACR Breast Density Category c: The breast tissue is heterogeneously
dense, which may obscure small masses.
FINDINGS: Questioned asymmetry within the superior aspect of the left breast
predominately resolved with additional imaging. Within the upper
inner left breast there is a 4 mm group of likely early dystrophic
calcifications. Within the retroareolar left breast demonstrated
best on the true lateral view there is a small oval circumscribed
mass.

Mammographic images were processed with CAD.

Targeted ultrasound is performed, showing a 5 x 2 x 4 mm simple cyst
left breast 6 o'clock position 1 cm from the nipple.

No suspicious abnormality identified within the upper outer and
upper inner left breast at the site of previously identified
mammographic asymmetry.
IMPRESSION: 1. Probably benign left breast calcifications.
2. Left breast simple cyst.
3. Questioned asymmetry superior left breast appeared to resolve
with additional imaging.

RECOMMENDATION:
1. Left breast diagnostic mammography with magnification views in 6
months to ensure stability of probably benign left breast
calcifications.

I have discussed the findings and recommendations with the patient.
Results were also provided in writing at the conclusion of the
visit. If applicable, a reminder letter will be sent to the patient
regarding the next appointment.

BI-RADS CATEGORY  3: Probably benign.

## 2020-04-30 ENCOUNTER — Other Ambulatory Visit: Payer: Self-pay | Admitting: Physician Assistant

## 2021-07-13 ENCOUNTER — Emergency Department (HOSPITAL_COMMUNITY)
Admission: EM | Admit: 2021-07-13 | Discharge: 2021-07-13 | Disposition: A | Discharge status: Home / Self Care | Payer: 59 | Attending: Emergency Medicine | Admitting: Emergency Medicine

## 2021-07-13 ENCOUNTER — Encounter (HOSPITAL_COMMUNITY): Payer: Self-pay | Admitting: Emergency Medicine

## 2021-07-13 ENCOUNTER — Emergency Department (HOSPITAL_COMMUNITY): Payer: 59

## 2021-07-13 DIAGNOSIS — R1013 Epigastric pain: Secondary | ICD-10-CM | POA: Insufficient documentation

## 2021-07-13 DIAGNOSIS — E860 Dehydration: Secondary | ICD-10-CM | POA: Insufficient documentation

## 2021-07-13 DIAGNOSIS — Z20822 Contact with and (suspected) exposure to covid-19: Secondary | ICD-10-CM | POA: Diagnosis not present

## 2021-07-13 DIAGNOSIS — R197 Diarrhea, unspecified: Secondary | ICD-10-CM | POA: Diagnosis not present

## 2021-07-13 DIAGNOSIS — Z716 Tobacco abuse counseling: Secondary | ICD-10-CM | POA: Insufficient documentation

## 2021-07-13 DIAGNOSIS — R112 Nausea with vomiting, unspecified: Secondary | ICD-10-CM | POA: Diagnosis present

## 2021-07-13 DIAGNOSIS — F17210 Nicotine dependence, cigarettes, uncomplicated: Secondary | ICD-10-CM | POA: Insufficient documentation

## 2021-07-13 LAB — URINALYSIS, ROUTINE W REFLEX MICROSCOPIC
Bacteria, UA: NONE SEEN
Bilirubin Urine: NEGATIVE
Glucose, UA: 50 mg/dL — AB
Ketones, ur: 80 mg/dL — AB
Nitrite: NEGATIVE
Protein, ur: NEGATIVE mg/dL
Specific Gravity, Urine: 1.021 (ref 1.005–1.030)
pH: 6 (ref 5.0–8.0)

## 2021-07-13 LAB — CBC WITH DIFFERENTIAL/PLATELET
Abs Immature Granulocytes: 0.11 10*3/uL — ABNORMAL HIGH (ref 0.00–0.07)
Basophils Absolute: 0.1 10*3/uL (ref 0.0–0.1)
Basophils Relative: 1 %
Eosinophils Absolute: 0.1 10*3/uL (ref 0.0–0.5)
Eosinophils Relative: 1 %
HCT: 44.4 % (ref 36.0–46.0)
Hemoglobin: 15.6 g/dL — ABNORMAL HIGH (ref 12.0–15.0)
Immature Granulocytes: 1 %
Lymphocytes Relative: 13 %
Lymphs Abs: 2.1 10*3/uL (ref 0.7–4.0)
MCH: 31.8 pg (ref 26.0–34.0)
MCHC: 35.1 g/dL (ref 30.0–36.0)
MCV: 90.6 fL (ref 80.0–100.0)
Monocytes Absolute: 1.1 10*3/uL — ABNORMAL HIGH (ref 0.1–1.0)
Monocytes Relative: 7 %
Neutro Abs: 13 10*3/uL — ABNORMAL HIGH (ref 1.7–7.7)
Neutrophils Relative %: 77 %
Platelets: 386 10*3/uL (ref 150–400)
RBC: 4.9 MIL/uL (ref 3.87–5.11)
RDW: 12.7 % (ref 11.5–15.5)
WBC: 16.4 10*3/uL — ABNORMAL HIGH (ref 4.0–10.5)
nRBC: 0 % (ref 0.0–0.2)

## 2021-07-13 LAB — COMPREHENSIVE METABOLIC PANEL
ALT: 17 U/L (ref 0–44)
AST: 24 U/L (ref 15–41)
Albumin: 4.3 g/dL (ref 3.5–5.0)
Alkaline Phosphatase: 85 U/L (ref 38–126)
Anion gap: 13 (ref 5–15)
BUN: 14 mg/dL (ref 6–20)
CO2: 20 mmol/L — ABNORMAL LOW (ref 22–32)
Calcium: 9.3 mg/dL (ref 8.9–10.3)
Chloride: 106 mmol/L (ref 98–111)
Creatinine, Ser: 0.78 mg/dL (ref 0.44–1.00)
GFR, Estimated: >60 mL/min (ref >60)
Glucose, Bld: 187 mg/dL — ABNORMAL HIGH (ref 70–99)
Potassium: 3.3 mmol/L — ABNORMAL LOW (ref 3.5–5.1)
Sodium: 139 mmol/L (ref 135–145)
Total Bilirubin: 0.6 mg/dL (ref 0.3–1.2)
Total Protein: 7.8 g/dL (ref 6.5–8.1)

## 2021-07-13 LAB — RESP PANEL BY RT-PCR (FLU A&B, COVID) ARPGX2
Influenza A by PCR: NEGATIVE
Influenza B by PCR: NEGATIVE
SARS Coronavirus 2 by RT PCR: NEGATIVE

## 2021-07-13 LAB — LIPASE, BLOOD: Lipase: 44 U/L (ref 11–51)

## 2021-07-13 LAB — HCG, SERUM, QUALITATIVE: Preg, Serum: NEGATIVE

## 2021-07-13 MED ORDER — IOHEXOL 350 MG/ML SOLN
80.0000 mL | Freq: Once | INTRAVENOUS | Status: AC | PRN
  Administered 2021-07-13: 12:00:00 80 mL via INTRAVENOUS

## 2021-07-13 MED ORDER — SODIUM CHLORIDE 0.9 % IV BOLUS: 1000.0000 mL | Freq: Once | INTRAVENOUS | Status: DC

## 2021-07-13 MED ORDER — METOCLOPRAMIDE HCL 5 MG/ML IJ SOLN
5.0000 mg | Freq: Once | INTRAMUSCULAR | Status: AC
  Administered 2021-07-13: 09:00:00 5 mg via INTRAVENOUS
  Filled 2021-07-13: qty 2

## 2021-07-13 MED ORDER — DIPHENHYDRAMINE HCL 50 MG/ML IJ SOLN
25.0000 mg | Freq: Once | INTRAMUSCULAR | Status: AC
  Administered 2021-07-13: 09:00:00 25 mg via INTRAVENOUS
  Filled 2021-07-13: qty 1

## 2021-07-13 MED ORDER — PROCHLORPERAZINE EDISYLATE 10 MG/2ML IJ SOLN
5.0000 mg | Freq: Once | INTRAMUSCULAR | Status: AC
  Administered 2021-07-13: 10:00:00 5 mg via INTRAVENOUS
  Filled 2021-07-13: qty 2

## 2021-07-13 MED ORDER — PROMETHAZINE HCL 25 MG PO TABS: 25.0000 mg | ORAL_TABLET | Freq: Three times a day (TID) | ORAL | 0 refills | Status: DC | PRN

## 2021-07-13 MED ORDER — ONDANSETRON 4 MG PO TBDP: 4.0000 mg | ORAL_TABLET | Freq: Once | ORAL | Status: DC

## 2021-07-13 MED ORDER — SODIUM CHLORIDE 0.9 % IV BOLUS
1000.0000 mL | Freq: Once | INTRAVENOUS | Status: AC
  Administered 2021-07-13: 09:00:00 1000 mL via INTRAVENOUS

## 2021-07-13 NOTE — Discharge Instructions (Addendum)

## 2021-07-13 NOTE — ED Provider Notes (Signed)
[facility]-EMERGENCY DEPT Provider Note   CSN: [id number] Arrival date & time: 07/13/21  6010     History Chief Complaint  Patient presents with   Emesis    Sheri Flores is a 45 y.o. female.  This is a 46 y.o. female with significant medical history as below, including anxiety, hemorrhoids who presents to the ED with complaint of nausea, vomiting, diarrhea, abdominal pain.  Patient reports sudden onset of symptoms approximately 6 AM this morning.  No sick contacts or recent travel.  No recent dietary changes.  No medications prior to arrival.  No history of abdominal surgery.  No change in bowel or bladder function.  No daily marijuana use.  Does not report similar symptoms in the past.  No frequent alcohol use, no illicit drug use reported.  Pain to her midepigastrium described as burning, aching sensation, nonradiating.  Unable to identify alleviating or exacerbating factors.  She is actively vomiting during assessment. She has had similar symptoms in the past.   The history is provided by the patient. No language interpreter was used.  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   Hematochezia 08/29/2018   Bronchitis 08/02/2018   Acute bilateral low back pain without sciatica 06/06/2018   Healthcare maintenance 05/09/2018   Loose stools 05/09/2018    Abnormal urinalysis 05/09/2018    Past Surgical History:  Procedure Laterality Date   TONSILLECTOMY     TUBAL LIGATION       OB History     Gravida  4   Para  3   Term      Preterm      AB  1   Living  2      SAB  1   IAB      Ectopic      Multiple      Live Births              Family History  Problem Relation Age of Onset   Breast cancer Paternal Grandmother 66   Breast cancer Maternal Grandmother    Colon cancer Paternal Grandfather    Rectal cancer Neg Hx    Stomach cancer Neg Hx    Esophageal cancer Neg Hx     Social History   Tobacco Use   Smoking status: Every Day    Packs/day: 0.50    Years: 20.00    Pack years: 10.00    Types: Cigarettes   Smokeless tobacco: Never  Vaping Use   Vaping Use: Never used  Substance Use Topics   Alcohol use: Yes    Comment: socially   Drug use: Not Currently    Types: Marijuana    Home Medications Prior to Admission medications   Medication Sig Start Date End Date Taking? Authorizing Provider  aspirin-acetaminophen-caffeine (EXCEDRIN MIGRAINE) [id number] MG tablet Take 2 tablets by mouth every 6 (six)  hours as needed for headache.   Yes [provider]  promethazine (PHENERGAN) 25 MG tablet Take 1 tablet (25 mg total) by mouth every 8 (eight) hours as needed for up to 10 doses for nausea or vomiting. 07/13/21  Yes Tanda Rockers A, DO  albuterol (VENTOLIN HFA) 108 (90 Base) MCG/ACT inhaler INHALE 2 PUFFS INTO THE LUNGS EVERY 6 HOURS AS NEEDED FOR WHEEZING OR SHORTNESS OF BREATH Patient not taking: Reported on 07/13/2021 04/30/20   Mayer Masker, PA-C    Allergies    Hydrocodone and Trazodone and nefazodone  Review of Systems   Review of Systems  Constitutional:  Negative for activity change and fever.  HENT:  Negative for facial swelling and trouble swallowing.   Eyes:  Negative for discharge and redness.  Respiratory:  Negative for cough and shortness of breath.   Cardiovascular:   Negative for chest pain and palpitations.  Gastrointestinal:  Positive for abdominal pain, diarrhea, nausea and vomiting. Negative for anal bleeding and blood in stool.       No melena  Genitourinary:  Negative for dysuria and flank pain.  Musculoskeletal:  Negative for back pain and gait problem.  Skin:  Negative for pallor and rash.  Neurological:  Negative for syncope and headaches.   Physical Exam Updated Vital Signs BP (!) 152/91   Pulse (!) 51   Temp (!) 97.5 F (36.4 C) (Oral)   Resp 18   SpO2 100%   Physical Exam Vitals and nursing note reviewed.  Constitutional:      General: She is not in acute distress.    Appearance: Normal appearance. She is ill-appearing and diaphoretic.  HENT:     Head: Normocephalic and atraumatic.     Right Ear: External ear normal.     Left Ear: External ear normal.     Nose: Nose normal.     Mouth/Throat:     Mouth: Mucous membranes are moist.  Eyes:     General: No scleral icterus.       Right eye: No discharge.        Left eye: No discharge.  Cardiovascular:     Rate and Rhythm: Normal rate and regular rhythm.     Pulses: Normal pulses.     Heart sounds: Normal heart sounds.  Pulmonary:     Effort: Pulmonary effort is normal. No respiratory distress.     Breath sounds: Normal breath sounds.  Abdominal:     General: Abdomen is flat.     Palpations: Abdomen is soft.     Tenderness: There is abdominal tenderness in the epigastric area.       Comments: No rebound, not distended, not peritoneal  Musculoskeletal:        General: Normal range of motion.     Cervical back: Normal range of motion.     Right lower leg: No edema.     Left lower leg: No edema.  Skin:    General: Skin is warm.     Capillary Refill: Capillary refill takes less than 2 seconds.  Neurological:     Mental Status: She is alert.  Psychiatric:        Mood and Affect: Mood normal.        Behavior: Behavior normal.    ED Results / Procedures / Treatments    Labs (all labs ordered are listed, but only abnormal results are displayed) Labs Reviewed  CBC WITH DIFFERENTIAL/PLATELET - Abnormal; Notable for the following components:  Result Value   WBC 16.4 (*)    Hemoglobin 15.6 (*)    Neutro Abs 13.0 (*)    Monocytes Absolute 1.1 (*)    Abs Immature Granulocytes 0.11 (*)    All other components within normal limits  COMPREHENSIVE METABOLIC PANEL - Abnormal; Notable for the following components:   Potassium 3.3 (*)    CO2 20 (*)    Glucose, Bld 187 (*)    All other components within normal limits  URINALYSIS, ROUTINE W REFLEX MICROSCOPIC - Abnormal; Notable for the following components:   Glucose, UA 50 (*)    Hgb urine dipstick SMALL (*)    Ketones, ur 80 (*)    Leukocytes,Ua TRACE (*)    All other components within normal limits  RESP PANEL BY RT-PCR (FLU A&B, COVID) ARPGX2  LIPASE, BLOOD  HCG, SERUM, QUALITATIVE    EKG EKG Interpretation  Date/Time:  Sunday July 13 2021 09:10:27 EST Ventricular Rate:  51 PR Interval:  115 QRS Duration: 99 QT Interval:  533 QTC Calculation: 491 R Axis:   57 Text Interpretation: Sinus rhythm Borderline short PR interval Borderline prolonged QT interval No prior tracing available Interpretation limited secondary to artifact Confirmed by Tanda Rockers ([id number]) on 07/13/2021 9:22:46 AM  Radiology CT ABDOMEN PELVIS W CONTRAST  Result Date: 07/13/2021 CLINICAL DATA:  Abdominal pain and vomiting this morning. EXAM: CT ABDOMEN AND PELVIS WITH CONTRAST TECHNIQUE: Multidetector CT imaging of the abdomen and pelvis was performed using the standard protocol following bolus administration of intravenous contrast. CONTRAST:  51mL OMNIPAQUE IOHEXOL 350 MG/ML SOLN COMPARISON:  10/19/2018 FINDINGS: Lower chest: Clear lung bases. Hepatobiliary: No focal liver abnormality is seen. No gallstones, gallbladder wall thickening, or biliary dilatation. Pancreas: Unremarkable. No pancreatic ductal dilatation or  surrounding inflammatory changes. Spleen: Normal in size without focal abnormality. Adrenals/Urinary Tract: Adrenal glands are unremarkable. Kidneys are normal, without renal calculi, focal lesion, or hydronephrosis. Bladder is unremarkable. Stomach/Bowel: Small sliding hiatal hernia. Stomach otherwise unremarkable. Small bowel and colon are normal in caliber. No wall thickening. No inflammation. Normal appendix visualized. Vascular/Lymphatic: Mild aortic atherosclerosis. No aneurysm. No enlarged lymph nodes. Reproductive: Uterus and bilateral adnexa are unremarkable. Other: No abdominal wall hernia or abnormality. No abdominopelvic ascites. Musculoskeletal: No fracture or acute finding.  No bone lesion. IMPRESSION: 1. No acute findings within the abdomen or pelvis. No evidence of bowel obstruction or inflammation. Normal appendix is visualized. 2. Mild aortic atherosclerosis. Electronically Signed   By: Amie Portland M.D.   On: 07/13/2021 12:29    Procedures .Critical Care Performed by: Sloan Leiter, DO Authorized by: Sloan Leiter, DO   Critical care provider statement:    Critical care time (minutes):  30   Critical care time was exclusive of:  Separately billable procedures and treating other patients   Critical care was necessary to treat or prevent imminent or life-threatening deterioration of the following conditions:  Dehydration   Critical care was time spent personally by me on the following activities:  Development of treatment plan with patient or surrogate, discussions with consultants, evaluation of patient's response to treatment, examination of patient, ordering and review of laboratory studies, ordering and review of radiographic studies, ordering and performing treatments and interventions, pulse oximetry, re-evaluation of patient's condition and review of old charts   Medications Ordered in ED Medications  sodium chloride 0.9 % bolus 1,000 mL (has no administration in time range)   sodium chloride 0.9 % bolus 1,000 mL (0 mLs Intravenous Stopped 07/13/21  1503)  metoCLOPramide (REGLAN) injection 5 mg (5 mg Intravenous Given 07/13/21 0905)  diphenhydrAMINE (BENADRYL) injection 25 mg (25 mg Intravenous Given 07/13/21 0905)  prochlorperazine (COMPAZINE) injection 5 mg (5 mg Intravenous Given 07/13/21 1009)  iohexol (OMNIPAQUE) 350 MG/ML injection 80 mL (80 mLs Intravenous Contrast Given 07/13/21 1132)    ED Course  I have reviewed the triage vital signs and the nursing notes.  Pertinent labs & imaging results that were available during my care of the patient were reviewed by me and considered in my medical decision making (see chart for details).  Clinical Course as of 07/13/21 1610  Sun Jul 13, 2021  1123 Emesis has resolved  [SG]    Clinical Course User Index [SG] Sloan Leiter, DO   MDM Rules/Calculators/A&P                           CC: Nausea, vomiting, abdominal pain, diarrhea  This patient complains of above; this involves an extensive number of treatment options and is a complaint that carries with it a high risk of complications and morbidity. Vital signs were reviewed. Serious etiologies considered.  Record review:   Previous records obtained and reviewed    Work up as above, notable for:  Labs & imaging results that were available during my care of the patient were reviewed by me and considered in my medical decision making.   I ordered imaging studies which included CTAP and I independently visualized and interpreted imaging which showed no acute process  Management: Patient given IV fluids, Reglan, Benadryl, will also obtain EKG in case further medications are required.  Obtain screening labs.  Reassessment:  Labs reviewed, concern for mild dehydration, given IVF x2L NS. Labs otherwise are stable. She feels significantly better after IVF and anti-emetics, she is tolerating oral intake, rpt abdomen exam is soft/nontender.  Leukocytosis  likely reactive in setting of emesis   Etiology of emesis today is unclear, she does report recurrent emesis and has and EGD previously. Will discharge with anti-emetics, bland diet instructions, close pcp f/u  The patient improved significantly and was discharged in stable condition. Detailed discussions were had with the patient regarding current findings, and need for close f/u with PCP or on call doctor. The patient has been instructed to return immediately if the symptoms worsen in any way for re-evaluation. Patient verbalized understanding and is in agreement with current care plan. All questions answered prior to discharge.     Counseled patient for approximately 4 minutes regarding smoking cessation. Discussed risks of smoking and how they applied and affected their visit here today. Patient not ready to quit at this time, however will follow up with their primary doctor when they are.   CPT code: 03474: intermediate counseling for smoking cessation        This chart was dictated using voice recognition software.  Despite best efforts to proofread,  errors can occur which can change the documentation meaning.  Final Clinical Impression(s) / ED Diagnoses Final diagnoses:  Nausea and vomiting, unspecified vomiting type  Encounter for tobacco use cessation counseling  Dehydration    Rx / DC Orders ED Discharge Orders          Ordered    promethazine (PHENERGAN) 25 MG tablet  Every 8 hours PRN        07/13/21 1451             Sloan Leiter, DO 07/13/21 1610

## 2021-07-13 NOTE — ED Triage Notes (Signed)
PT c/o vomiting this morning with abdominal pain. Actively vomiting in triage. Unable to obtain temperature and BP in triage.

## 2021-07-13 NOTE — ED Notes (Signed)
Patient transported to CT 

## 2021-11-20 ENCOUNTER — Emergency Department (HOSPITAL_COMMUNITY): Payer: 59

## 2021-11-20 ENCOUNTER — Other Ambulatory Visit: Payer: Self-pay

## 2021-11-20 ENCOUNTER — Emergency Department (HOSPITAL_COMMUNITY)
Admission: EM | Admit: 2021-11-20 | Discharge: 2021-11-20 | Disposition: A | Discharge status: Home / Self Care | Payer: 59 | Attending: Emergency Medicine | Admitting: Emergency Medicine

## 2021-11-20 ENCOUNTER — Encounter (HOSPITAL_COMMUNITY): Payer: Self-pay

## 2021-11-20 DIAGNOSIS — F17210 Nicotine dependence, cigarettes, uncomplicated: Secondary | ICD-10-CM | POA: Diagnosis not present

## 2021-11-20 DIAGNOSIS — M542 Cervicalgia: Secondary | ICD-10-CM | POA: Insufficient documentation

## 2021-11-20 DIAGNOSIS — M25511 Pain in right shoulder: Secondary | ICD-10-CM | POA: Insufficient documentation

## 2021-11-20 MED ORDER — DIAZEPAM 5 MG PO TABS
5.0000 mg | ORAL_TABLET | Freq: Once | ORAL | Status: AC
  Administered 2021-11-20: 5 mg via ORAL
  Filled 2021-11-20: qty 1

## 2021-11-20 MED ORDER — NAPROXEN 375 MG PO TABS: 375.0000 mg | ORAL_TABLET | Freq: Two times a day (BID) | ORAL | 0 refills | Status: AC

## 2021-11-20 MED ORDER — CYCLOBENZAPRINE HCL 10 MG PO TABS: 10.0000 mg | ORAL_TABLET | Freq: Two times a day (BID) | ORAL | 0 refills | Status: AC | PRN

## 2021-11-20 MED ORDER — LIDOCAINE 5 % EX PTCH
1.0000 | MEDICATED_PATCH | CUTANEOUS | Status: DC
  Administered 2021-11-20: 1 via TRANSDERMAL
  Filled 2021-11-20: qty 1

## 2021-11-20 NOTE — ED Notes (Signed)
An After Visit Summary was printed and given to the patient. Discharge instructions given and no further questions at this time.  

## 2021-11-20 NOTE — ED Provider Notes (Signed)
?[facility]-EMERGENCY DEPT ?Provider Note ? ? ?CSN: [id number] ?Arrival date & time: 11/20/21  1430 ? ?  ? ?History ? ?Chief Complaint  ?Patient presents with  ? Shoulder Pain  ? ? ?Sheri Flores is a 46 y.o. female. ? ?46 y.o female with no PMH presents to the ED with a chief complaint of neck pain x few weeks. Patient reports a dull, pulling sensation to the right side of her neck exacerbated with any movement and rotation of her neck.  She has taken 1 muscle relaxer prior to arrival in the ED without any improvement in her symptoms.  Pain does radiate sometimes to her right shoulder.  Does have a history of smoking, about 1 pack in the last 3 days. She does not have any prior cardiac history. No neck pain, no vision changes, no extremity weakness.  ? ?The history is provided by the patient and medical records.  ?Shoulder Pain ?Location:  Shoulder ?Shoulder location:  R shoulder ?Injury: no   ?Pain details:  ?  Quality:  Throbbing ?  Onset quality:  Gradual ?  Timing:  Intermittent ?  Progression:  Worsening ?Handedness:  Right-handed ?Dislocation: no   ?Associated symptoms: neck pain   ?Associated symptoms: no back pain and no fever   ? ?  ? ?Home Medications ?Prior to Admission medications   ?Medication Sig Start Date End Date Taking? Authorizing Provider  ?cyclobenzaprine (FLEXERIL) 10 MG tablet Take 1 tablet (10 mg total) by mouth 2 (two) times daily as needed for up to 7 days for muscle spasms. 11/20/21 11/27/21 Yes Hadassah Rana, Leonie DouglasJohana, PA-C  ?naproxen (NAPROSYN) 375 MG tablet Take 1 tablet (375 mg total) by mouth 2 (two) times daily for 7 days. 11/20/21 11/27/21 Yes Althea Backs, PA-C  ?albuterol (VENTOLIN HFA) 108 (90 Base) MCG/ACT inhaler INHALE 2 PUFFS INTO THE LUNGS EVERY 6 HOURS AS NEEDED FOR WHEEZING OR SHORTNESS OF BREATH ?Patient not taking: Reported on 07/13/2021 04/30/20   Mayer MaskerAbonza, Maritza, PA-C  ?aspirin-acetaminophen-caffeine (EXCEDRIN MIGRAINE) [id number] MG tablet Take 2 tablets by mouth  every 6 (six) hours as needed for headache.    [provider]  ?promethazine (PHENERGAN) 25 MG tablet Take 1 tablet (25 mg total) by mouth every 8 (eight) hours as needed for up to 10 doses for nausea or vomiting. 07/13/21   Sloan LeiterGray, Samuel A, DO  ?   ? ?Allergies    ?Hydrocodone and Trazodone and nefazodone   ? ?Review of Systems   ?Review of Systems  ?Constitutional:  Negative for chills and fever.  ?HENT:  Negative for sore throat.   ?Respiratory:  Negative for shortness of breath.   ?Cardiovascular:  Negative for chest pain.  ?Gastrointestinal:  Negative for diarrhea and vomiting.  ?Genitourinary:  Negative for flank pain.  ?Musculoskeletal:  Positive for neck pain and neck stiffness. Negative for back pain and myalgias.  ? ?Physical Exam ?Updated Vital Signs ?BP (!) 142/89   Pulse 77   Temp 98 ?F (36.7 ?C) (Oral)   Resp 18   SpO2 98%  ?Physical Exam ?Vitals and nursing note reviewed.  ?Constitutional:   ?   Appearance: Normal appearance.  ?HENT:  ?   Head: Normocephalic and atraumatic.  ?   Nose: Nose normal.  ?   Mouth/Throat:  ?   Mouth: Mucous membranes are moist.  ?Eyes:  ?   Pupils: Pupils are equal, round, and reactive to light.  ?Cardiovascular:  ?   Rate and Rhythm: Normal rate.  ?Pulmonary:  ?  Effort: Pulmonary effort is normal.  ?   Breath sounds: No wheezing.  ?Abdominal:  ?   General: Abdomen is flat.  ?Musculoskeletal:  ?   Cervical back: Normal range of motion and neck supple. Tenderness present. No rigidity.  ?Lymphadenopathy:  ?   Cervical: No cervical adenopathy.  ?Skin: ?   General: Skin is warm and dry.  ?Neurological:  ?   Mental Status: She is alert and oriented to person, place, and time.  ? ? ?ED Results / Procedures / Treatments   ?Labs ?(all labs ordered are listed, but only abnormal results are displayed) ?Labs Reviewed - No data to display ? ?EKG ?EKG Interpretation ? ?Date/Time:  Thursday November 20 2021 15:43:21 EDT ?Ventricular Rate:  56 ?PR Interval:  108 ?QRS  Duration: 94 ?QT Interval:  455 ?QTC Calculation: 440 ?R Axis:   44 ?Text Interpretation: Sinus rhythm Short PR interval Similar to previous Confirmed by Coralee Pesa [phone]) on 11/20/2021 4:12:01 PM ? ?Radiology ?DG Cervical Spine Complete ? ?Result Date: 11/20/2021 ?CLINICAL DATA:  Pain. EXAM: CERVICAL SPINE - COMPLETE 4+ VIEW COMPARISON:  None. FINDINGS: There is no evidence of cervical spine fracture or prevertebral soft tissue swelling. There is mild disc space narrowing at C4-C5, C5-C6 and C6-C7. Minimal osteophytes are seen at these levels. There is straightening of normal cervical lordosis. IMPRESSION: 1. No evidence for fracture or malalignment. 2. Early degenerative changes. Electronically Signed   By: Darliss Cheney M.D.   On: 11/20/2021 16:13  ? ?DG Shoulder Right ? ?Result Date: 11/20/2021 ?CLINICAL DATA:  Pain beginning 1 month ago. EXAM: RIGHT SHOULDER - 2+ VIEW COMPARISON:  None. FINDINGS: No acute fracture or dislocation. Joint spaces maintained. Visualized portion of the right hemithorax is normal. IMPRESSION: No acute osseous abnormality. Electronically Signed   By: Jeronimo Greaves M.D.   On: 11/20/2021 16:13   ? ?Procedures ?Procedures  ? ? ?Medications Ordered in ED ?Medications  ?lidocaine (LIDODERM) 5 % 1 patch (has no administration in time range)  ?diazepam (VALIUM) tablet 5 mg (5 mg Oral Given 11/20/21 1548)  ? ? ?ED Course/ Medical Decision Making/ A&P ?  ?                        ?Medical Decision Making ?Amount and/or Complexity of Data Reviewed ?Radiology: ordered. ? ?Risk ?Prescription drug management. ? ? ? ?This patient presents to the ED for concern of neck pain, this involves a number of treatment options, and is a complaint that carries with it a high risk of complications and morbidity.  The differential diagnosis includes meningitis, neck strain, ACS, dissection.  ? ? ?Co morbidities: ?Discussed in HPI ? ? ?Brief History: ? ?Patient here with no past medical history presents to the ED with  right sided neck pain that began a few weeks ago, no improvement despite taking 1 muscle relaxer today.  Pain does radiate down her right arm however does not have a headache, vision changes, upper extremity weaknesses.  Ambulated in the ED with a steady gait.  Does have underlying history of smoking.  Arrived in the ED hypertensive with a systolic in the 150s diastolic in the 100s.  No prior hypertension recorded on her chart. ? ?EMR reviewed including pt PMHx, past surgical history and past visits to ER.  ? ?See HPI for more details ? ?Medicines ordered: ? ?I ordered medication including valium  for muscle spasms ?Reevaluation of the patient after these medicines showed that the  patient improved ?I have reviewed the patients home medicines and have made adjustments as needed ?Imaging obtained which was negative to her right shoulder, negative for cervical spine. ? ?Reevaluation: ? ?After the interventions noted above I re-evaluated patient and found that they have :improved ? ? ?Social Determinants of Health: ? ?The patient's social determinants of health were a factor in the care of this patient ? ? ? ?Problem List / ED Course: ? ?Patient here with right-sided neck pain that began a few weeks ago, no improvement despite over-the-counter medications.  No red flags such as chest pain, shortness of breath, vision changes, headache, upper extremity weaknesses.  Given Valium for pain control. ?EKG was obtained due to description of the pain, EKG without any ST changes, no changes consistent with infarct. ?It is reproducible with palpation, I do have a lower suspicion for muscle strain.  Does have full range of motion of her neck with pain, no meningismal signs, low suspicion for meningitis.  ? ? ?Dispostion: ? ?After consideration of the diagnostic results and the patients response to treatment, I feel that the patent would benefit from RICE therapy, muscle relaxers and NSAIDS. Patient is agreeable of plan and  management. Improvement in BP after medication, patient stable for discharge. ?BP (!) 142/89   Pulse 77   Temp 98 ?F (36.7 ?C) (Oral)   Resp 18   SpO2 98%  ? ?  ?Portions of this note were generated with Dragon dictation

## 2021-11-20 NOTE — ED Triage Notes (Signed)
Pt reports right sided neck pain that radiates to shoulder and won arm that began x1 month ago. Denies recent injury.  ?

## 2021-11-20 NOTE — Discharge Instructions (Addendum)
I have prescribed muscle relaxers for your pain, please do not drink or drive while taking this medications as it can make you drowsy.  ? ?I have also prescribed antiinflammatories, please take these as prescribed for your pain.  ? ?If you experience any changes in vision, vomiting, worsening symptoms please return to the ED.  ?

## 2022-11-10 ENCOUNTER — Encounter (HOSPITAL_BASED_OUTPATIENT_CLINIC_OR_DEPARTMENT_OTHER): Payer: Self-pay | Admitting: Obstetrics and Gynecology

## 2022-11-11 ENCOUNTER — Encounter (HOSPITAL_COMMUNITY): Payer: Self-pay | Admitting: *Deleted

## 2022-11-11 NOTE — Progress Notes (Signed)
Spoke w/ via phone for pre-op interview--- pt Lab needs dos----  urine preg              Lab results------ pt has lab appt 03-27 @ 1430 getting CBC/ CMP/ T&S COVID test -----patient states asymptomatic no test needed Arrive at -------  0530 on 11-17-2022 NPO after MN NO Solid Food.  Clear liquids from MN until--- 0430 Med rec completed Medications to take morning of surgery ----- none Diabetic medication ----- n/a Patient instructed no nail polish to be worn day of surgery Patient instructed to bring photo id and insurance card day of surgery Patient aware to have Driver (ride ) / caregiver    for 24 hours after surgery -- husband, Sheri Flores Patient Special Instructions ----- pt will pick up bag w/ hibiclens soap and instructions at lab appt Pre-Op special Istructions ----- pt has medical clearance from pcp received via fax from Dr Melba Coon office, placed in chart. Patient verbalized understanding of instructions that were given at this phone interview. Patient denies shortness of breath, chest pain, fever, cough at this phone interview.

## 2022-11-11 NOTE — Progress Notes (Signed)
Your procedure is scheduled on   Monday,  11-16-2022  Report to [facility] AT  __5:30_ AM.   Call this number if you have problems the morning of surgery  :[phone].   OUR ADDRESS IS [place].  WE ARE LOCATED IN [facility].  PLEASE BRING YOUR INSURANCE CARD AND PHOTO ID DAY OF SURGERY.  ONLY 2 PEOPLE ARE ALLOWED IN  WAITING  ROOM                                      REMEMBER:  DO NOT EAT FOOD  AFTER MIDNIGHT THE NIGHT BEFORE YOUR SURGERY . YOU MAY HAVE CLEAR LIQUIDS FROM MIDNIGHT THE NIGHT BEFORE YOUR SURGERY UNTIL  _4:30 AM. NO CLEAR LIQUIDS AFTER   _4:30 AM DAY OF SURGERY.  YOU MAY  BRUSH YOUR TEETH MORNING OF SURGERY AND RINSE YOUR MOUTH OUT, NO CHEWING GUM CANDY OR MINTS.   CLEAR LIQUIDS ALLOWED  Water                                                                   Black Coffee and tea, regular and decaf  (NO cream or milk products of any type, may sweeten)                         Carbonated beverages, regular and diet                                    Sports drinks like Gatorade _____________________________________________________________________     TAKE ONLY THESE MEDICATIONS MORNING OF SURGERY:  None Please bring rescue inhaler day of surgery    UP TO 4 VISITORS  MAY VISIT IN THE EXTENDED RECOVERY ROOM UNTIL 800 PM ONLY.  ONE  VISITOR AGE 23 AND OVER MAY SPEND THE NIGHT AND MUST BE IN EXTENDED RECOVERY ROOM NO LATER THAN 800 PM . YOUR DISCHARGE TIME AFTER YOU SPEND THE NIGHT IS 900 AM THE MORNING AFTER YOUR SURGERY.  YOU MAY PACK A SMALL OVERNIGHT BAG WITH TOILETRIES FOR YOUR OVERNIGHT STAY IF YOU WISH.  YOUR PRESCRIPTION MEDICATIONS WILL BE PROVIDED DURING [place].                                      DO NOT WEAR JEWERLY/  METAL/  PIERCINGS (INCLUDING NO PLASTIC PIERCINGS) DO NOT WEAR LOTIONS, POWDERS, PERFUMES OR NAIL POLISH ON YOUR FINGERNAILS. TOENAIL POLISH IS OK TO WEAR. DO NOT SHAVE FOR 48  HOURS PRIOR TO DAY OF SURGERY.  CONTACTS, GLASSES, OR DENTURES MAY NOT BE WORN TO SURGERY.  REMEMBER: NO SMOKING, VAPING ,  DRUGS OR ALCOHOL FOR 24 HOURS BEFORE YOUR SURGERY.                                    [facility] IS NOT RESPONSIBLE  FOR ANY BELONGINGS.                                                                    [facility] - Preparing for Surgery Before surgery, you can play an important role.  Because skin is not sterile, your skin needs to be as free of germs as possible.  You can reduce the number of germs on your skin by washing with CHG (chlorahexidine gluconate) soap before surgery.  CHG is an antiseptic cleaner which kills germs and bonds with the skin to continue killing germs even after washing. Please DO NOT use if you have an allergy to CHG or antibacterial soaps.  If your skin becomes reddened/irritated stop using the CHG and inform your nurse when you arrive at Short Stay. Do not shave (including legs and underarms) for at least 48 hours prior to the first CHG shower.  You may shave your face/neck. Please follow these instructions carefully:  1.  Shower with CHG Soap the night before surgery and the  morning of Surgery.  2.  If you choose to wash your hair, wash your hair first as usual with your  normal  shampoo.  3.  After you shampoo, rinse your hair and body thoroughly to remove the  shampoo.                                        4.  Use CHG as you would any other liquid soap.  You can apply chg directly  to the skin and wash , chg soap provided, night before and morning of your surgery.  5.  Apply the CHG Soap to your body ONLY FROM THE NECK DOWN.   Do not use on face/ open                           Wound or open sores. Avoid contact with eyes, ears mouth and genitals (private parts).                       Wash face,  Genitals (private parts) with your normal soap.             6.  Wash thoroughly, paying special attention to the area where your  surgery  will be performed.  7.  Thoroughly rinse your body with warm water from the neck down.  8.  DO NOT shower/wash with your normal soap after using and rinsing off  the CHG Soap.             9.  Pat yourself dry with a clean towel.            10.  Wear clean pajamas.            11.  Place clean sheets on your bed the night of your first shower and do not  sleep with pets. Day of Surgery : Do not apply any lotions/ powders the morning of surgery.  Please wear clean clothes to the hospital/surgery center.  IF YOU HAVE ANY SKIN IRRITATION OR PROBLEMS WITH THE SURGICAL SOAP, PLEASE GET A BAR OF GOLD DIAL SOAP AND SHOWER THE NIGHT BEFORE YOUR SURGERY AND THE MORNING OF YOUR SURGERY. PLEASE LET THE NURSE KNOW MORNING OF YOUR SURGERY IF YOU HAD ANY PROBLEMS WITH THE SURGICAL SOAP.   ________________________________________________________________________  QUESTIONS CALL Yolanda Huffstetler PRE OP NURSE PHONE [phone].

## 2022-11-12 ENCOUNTER — Encounter (HOSPITAL_COMMUNITY)
Admission: RE | Admit: 2022-11-12 | Discharge: 2022-11-12 | Disposition: A | Discharge status: Home / Self Care | Payer: 59 | Source: Ambulatory Visit | Attending: Obstetrics and Gynecology | Admitting: Obstetrics and Gynecology

## 2022-11-12 DIAGNOSIS — Z01812 Encounter for preprocedural laboratory examination: Secondary | ICD-10-CM | POA: Diagnosis not present

## 2022-11-12 DIAGNOSIS — Z01818 Encounter for other preprocedural examination: Secondary | ICD-10-CM

## 2022-11-12 LAB — CBC
HCT: 47.1 % — ABNORMAL HIGH (ref 36.0–46.0)
Hemoglobin: 16.4 g/dL — ABNORMAL HIGH (ref 12.0–15.0)
MCH: 31.7 pg (ref 26.0–34.0)
MCHC: 34.8 g/dL (ref 30.0–36.0)
MCV: 91.1 fL (ref 80.0–100.0)
Platelets: 361 10*3/uL (ref 150–400)
RBC: 5.17 MIL/uL — ABNORMAL HIGH (ref 3.87–5.11)
RDW: 12.2 % (ref 11.5–15.5)
WBC: 6.5 10*3/uL (ref 4.0–10.5)
nRBC: 0 % (ref 0.0–0.2)

## 2022-11-12 LAB — COMPREHENSIVE METABOLIC PANEL
ALT: 15 U/L (ref 0–44)
AST: 16 U/L (ref 15–41)
Albumin: 4.6 g/dL (ref 3.5–5.0)
Alkaline Phosphatase: 72 U/L (ref 38–126)
Anion gap: 9 (ref 5–15)
BUN: 12 mg/dL (ref 6–20)
CO2: 27 mmol/L (ref 22–32)
Calcium: 9 mg/dL (ref 8.9–10.3)
Chloride: 103 mmol/L (ref 98–111)
Creatinine, Ser: 0.79 mg/dL (ref 0.44–1.00)
GFR, Estimated: >60 mL/min (ref >60)
Glucose, Bld: 98 mg/dL (ref 70–99)
Potassium: 3 mmol/L — ABNORMAL LOW (ref 3.5–5.1)
Sodium: 139 mmol/L (ref 135–145)
Total Bilirubin: 0.4 mg/dL (ref 0.3–1.2)
Total Protein: 7.4 g/dL (ref 6.5–8.1)

## 2022-11-15 NOTE — Anesthesia Preprocedure Evaluation (Signed)
Anesthesia Evaluation  Patient identified by MRN, date of birth, ID band Patient awake    Reviewed: Allergy & Precautions, NPO status , Patient's Chart, lab work & pertinent test results  History of Anesthesia Complications Negative for: history of anesthetic complications  Airway Mallampati: I  TM Distance: >3 FB Neck ROM: Full    Dental  (+) Edentulous Upper, Edentulous Lower   Pulmonary asthma , COPD,  COPD inhaler, Current Smoker and Patient abstained from smoking.   breath sounds clear to auscultation       Cardiovascular negative cardio ROS  Rhythm:Regular Rate:Normal     Neuro/Psych   Anxiety Depression    negative neurological ROS     GI/Hepatic negative GI ROS, Neg liver ROS,,,  Endo/Other  negative endocrine ROS    Renal/GU negative Renal ROS     Musculoskeletal   Abdominal   Peds  Hematology negative hematology ROS (+)   Anesthesia Other Findings   Reproductive/Obstetrics                             Anesthesia Physical Anesthesia Plan  ASA: 2  Anesthesia Plan: General   Post-op Pain Management: Tylenol PO (pre-op)*   Induction: Intravenous  PONV Risk Score and Plan: 2 and Ondansetron and Dexamethasone  Airway Management Planned: Oral ETT  Additional Equipment: None  Intra-op Plan:   Post-operative Plan: Extubation in OR  Informed Consent: I have reviewed the patients History and Physical, chart, labs and discussed the procedure including the risks, benefits and alternatives for the proposed anesthesia with the patient or authorized representative who has indicated his/her understanding and acceptance.       Plan Discussed with: CRNA and Surgeon  Anesthesia Plan Comments:         Anesthesia Quick Evaluation

## 2022-11-15 NOTE — H&P (Signed)
Sheri Flores is an 47 y.o. female [id number] with adenomyosis and pelvic pain for definitive management - scheduled for daVinci Robot Assisted Total Laparoscopic Hysterectomy, Bilateral Salpingoopherectomy.  D/W pt r/b/a of surgical procedure also process and expectations.  D/W pt surgical menopause, states she believes she is in menopause and desires oopherectomy.  US reveals likely adenomyosis.  Pertinent Gynecological History: [id number] - SVD x 3, GDM x 1 - 6#9-8#5 (child passed at 64 mo) No abn pap, last 10/23 H/o HSV Menstrual History:  No LMP recorded. (Menstrual status: Other).    Past Medical History:  Diagnosis Date   Adenomyosis of uterus    GAD (generalized anxiety disorder)    Hemorrhoids    History of gastritis 0000000   History of Helicobacter pylori infection 09/2018   per EGD bx ;   completed treatment   MDD (major depressive disorder)    Mild asthma    Pelvic pain   ADHD, h/o GDM, HA, seasonal allergies, depression  Past Surgical History:  Procedure Laterality Date   COLONOSCOPY WITH ESOPHAGOGASTRODUODENOSCOPY (EGD)  10/10/2018   dr Loletha Carrow   TONSILLECTOMY     age 51   TUBAL LIGATION Bilateral 2000   PPTL    Family History  Problem Relation Age of Onset   Breast cancer Paternal Grandmother 21   Breast cancer Maternal Grandmother    Colon cancer Paternal Grandfather    Rectal cancer Neg Hx    Stomach cancer Neg Hx    Esophageal cancer Neg Hx   DM, HTN, thyroid disease, glaucoma  Social History:  reports that she has been smoking cigarettes. She has never used smokeless tobacco. She reports that she does not currently use alcohol. She reports that she does not currently use drugs after having used the following drugs: Marijuana. married  Allergies:  Allergies  Allergen Reactions   Aspartame Swelling   Aspirin     Gi upset   Hydrocodone Itching   Trazodone And Nefazodone Other (See Comments)    nightmares    Meds: albuterol, omeprazole, Zofran, Slynd,  tramadol  Review of Systems  Constitutional: Negative.   Respiratory: Negative.    Cardiovascular: Negative.   Gastrointestinal: Negative.   Genitourinary:  Positive for menstrual problem and pelvic pain.  Musculoskeletal: Negative.   Skin: Negative.   Neurological: Negative.   Psychiatric/Behavioral: Negative.      Height 5\' 4"  (1.626 m), weight 55.8 kg. Physical Exam Constitutional:      Appearance: Normal appearance.  HENT:     Head: Normocephalic and atraumatic.  Cardiovascular:     Rate and Rhythm: Normal rate and regular rhythm.  Pulmonary:     Effort: Pulmonary effort is normal.     Breath sounds: Normal breath sounds.  Abdominal:     General: Bowel sounds are normal.     Palpations: Abdomen is soft.  Genitourinary:    General: Normal vulva.     Rectum: Normal.  Musculoskeletal:        General: Normal range of motion.     Cervical back: Normal range of motion and neck supple.  Skin:    General: Skin is warm and dry.  Neurological:     General: No focal deficit present.     Mental Status: She is alert and oriented to person, place, and time.  Psychiatric:        Mood and Affect: Mood normal.        Behavior: Behavior normal.     [place] sm post L lateral  fibroid, ow nl anteverted uterus with adenomyosis, limited view R ovary, nl L ovary  Assessment/Plan: [id number] with adenomyosis for definitive management - daVincin assisted TLH/BSO, poss cystoscopy D/w pt r/b/a Ancef for prophylaxis D/W pt post op course and expectations.    Dyland Panuco Bovard-Stuckert 11/15/2022, 7:57 PM

## 2022-11-16 ENCOUNTER — Ambulatory Visit (HOSPITAL_BASED_OUTPATIENT_CLINIC_OR_DEPARTMENT_OTHER): Payer: 59 | Admitting: Anesthesiology

## 2022-11-16 ENCOUNTER — Encounter (HOSPITAL_BASED_OUTPATIENT_CLINIC_OR_DEPARTMENT_OTHER)
Admission: RE | Disposition: A | Discharge status: Home / Self Care | Payer: Self-pay | Source: Ambulatory Visit | Attending: Obstetrics and Gynecology

## 2022-11-16 ENCOUNTER — Other Ambulatory Visit: Payer: Self-pay

## 2022-11-16 ENCOUNTER — Ambulatory Visit (HOSPITAL_BASED_OUTPATIENT_CLINIC_OR_DEPARTMENT_OTHER)
Admission: RE | Admit: 2022-11-16 | Discharge: 2022-11-16 | Disposition: A | Discharge status: Home / Self Care | Payer: 59 | Source: Ambulatory Visit | Attending: Obstetrics and Gynecology | Admitting: Obstetrics and Gynecology

## 2022-11-16 ENCOUNTER — Encounter (HOSPITAL_BASED_OUTPATIENT_CLINIC_OR_DEPARTMENT_OTHER): Payer: Self-pay | Admitting: Obstetrics and Gynecology

## 2022-11-16 DIAGNOSIS — D251 Intramural leiomyoma of uterus: Secondary | ICD-10-CM | POA: Insufficient documentation

## 2022-11-16 DIAGNOSIS — F411 Generalized anxiety disorder: Secondary | ICD-10-CM | POA: Insufficient documentation

## 2022-11-16 DIAGNOSIS — F17210 Nicotine dependence, cigarettes, uncomplicated: Secondary | ICD-10-CM | POA: Insufficient documentation

## 2022-11-16 DIAGNOSIS — J4489 Other specified chronic obstructive pulmonary disease: Secondary | ICD-10-CM | POA: Diagnosis not present

## 2022-11-16 DIAGNOSIS — N8003 Adenomyosis of the uterus: Secondary | ICD-10-CM | POA: Insufficient documentation

## 2022-11-16 DIAGNOSIS — N888 Other specified noninflammatory disorders of cervix uteri: Secondary | ICD-10-CM | POA: Diagnosis not present

## 2022-11-16 DIAGNOSIS — Z01818 Encounter for other preprocedural examination: Secondary | ICD-10-CM

## 2022-11-16 DIAGNOSIS — Z9889 Other specified postprocedural states: Secondary | ICD-10-CM

## 2022-11-16 HISTORY — DX: Other complications of anesthesia, initial encounter: T88.59XA

## 2022-11-16 HISTORY — DX: Major depressive disorder, single episode, unspecified: F32.9

## 2022-11-16 HISTORY — DX: Adenomyosis of the uterus: N80.03

## 2022-11-16 HISTORY — DX: Unspecified asthma, uncomplicated: J45.909

## 2022-11-16 HISTORY — PX: CYSTOSCOPY: SHX5120

## 2022-11-16 HISTORY — DX: Generalized anxiety disorder: F41.1

## 2022-11-16 HISTORY — DX: Attention-deficit hyperactivity disorder, unspecified type: F90.9

## 2022-11-16 HISTORY — DX: Pelvic and perineal pain: R10.2

## 2022-11-16 HISTORY — PX: ROBOTIC ASSISTED TOTAL HYSTERECTOMY WITH BILATERAL SALPINGO OOPHERECTOMY: SHX6086

## 2022-11-16 LAB — BASIC METABOLIC PANEL
Anion gap: 8 (ref 5–15)
BUN: 6 mg/dL (ref 6–20)
CO2: 26 mmol/L (ref 22–32)
Calcium: 8.5 mg/dL — ABNORMAL LOW (ref 8.9–10.3)
Chloride: 103 mmol/L (ref 98–111)
Creatinine, Ser: 0.69 mg/dL (ref 0.44–1.00)
GFR, Estimated: >60 mL/min (ref >60)
Glucose, Bld: 140 mg/dL — ABNORMAL HIGH (ref 70–99)
Potassium: 3.4 mmol/L — ABNORMAL LOW (ref 3.5–5.1)
Sodium: 137 mmol/L (ref 135–145)

## 2022-11-16 LAB — TYPE AND SCREEN
ABO/RH(D): A POS
Antibody Screen: NEGATIVE

## 2022-11-16 LAB — ABO/RH: ABO/RH(D): A POS

## 2022-11-16 LAB — CBC
HCT: 39.6 % (ref 36.0–46.0)
Hemoglobin: 13.3 g/dL (ref 12.0–15.0)
MCH: 31.5 pg (ref 26.0–34.0)
MCHC: 33.6 g/dL (ref 30.0–36.0)
MCV: 93.8 fL (ref 80.0–100.0)
Platelets: 259 10*3/uL (ref 150–400)
RBC: 4.22 MIL/uL (ref 3.87–5.11)
RDW: 12.3 % (ref 11.5–15.5)
WBC: 12.7 10*3/uL — ABNORMAL HIGH (ref 4.0–10.5)
nRBC: 0 % (ref 0.0–0.2)

## 2022-11-16 LAB — POCT PREGNANCY, URINE: Preg Test, Ur: NEGATIVE

## 2022-11-16 SURGERY — HYSTERECTOMY, TOTAL, ROBOT-ASSISTED, LAPAROSCOPIC, WITH BILATERAL SALPINGO-OOPHORECTOMY
Anesthesia: General | Site: Bladder

## 2022-11-16 MED ORDER — OXYCODONE-ACETAMINOPHEN 5-325 MG PO TABS
ORAL_TABLET | ORAL | Status: AC
  Filled 2022-11-16: qty 2

## 2022-11-16 MED ORDER — LACTATED RINGERS IV SOLN: INTRAVENOUS | Status: DC

## 2022-11-16 MED ORDER — ONDANSETRON HCL 4 MG PO TABS: 4.0000 mg | ORAL_TABLET | Freq: Four times a day (QID) | ORAL | Status: DC | PRN

## 2022-11-16 MED ORDER — ROCURONIUM BROMIDE 10 MG/ML (PF) SYRINGE
PREFILLED_SYRINGE | INTRAVENOUS | Status: AC
  Filled 2022-11-16: qty 10

## 2022-11-16 MED ORDER — POVIDONE-IODINE 10 % EX SWAB: 2.0000 | Freq: Once | CUTANEOUS | Status: DC

## 2022-11-16 MED ORDER — OXYCODONE HCL 5 MG PO TABS: 5.0000 mg | ORAL_TABLET | Freq: Once | ORAL | Status: DC | PRN

## 2022-11-16 MED ORDER — ACETAMINOPHEN 500 MG PO TABS
ORAL_TABLET | ORAL | Status: AC
  Filled 2022-11-16: qty 2

## 2022-11-16 MED ORDER — EPHEDRINE 5 MG/ML INJ
INTRAVENOUS | Status: AC
  Filled 2022-11-16: qty 5

## 2022-11-16 MED ORDER — BUPIVACAINE HCL (PF) 0.25 % IJ SOLN
INTRAMUSCULAR | Status: DC | PRN
  Administered 2022-11-16: 10 mL

## 2022-11-16 MED ORDER — STERILE WATER FOR IRRIGATION IR SOLN
Status: DC | PRN
  Administered 2022-11-16: 500 mL

## 2022-11-16 MED ORDER — FLUTICASONE PROPIONATE 50 MCG/ACT NA SUSP: 1.0000 | Freq: Every day | NASAL | Status: DC

## 2022-11-16 MED ORDER — EPHEDRINE SULFATE (PRESSORS) 50 MG/ML IJ SOLN
INTRAMUSCULAR | Status: DC | PRN
  Administered 2022-11-16: 10 mg via INTRAVENOUS
  Administered 2022-11-16: 5 mg via INTRAVENOUS

## 2022-11-16 MED ORDER — ONDANSETRON HCL 4 MG/2ML IJ SOLN
INTRAMUSCULAR | Status: AC
  Filled 2022-11-16: qty 2

## 2022-11-16 MED ORDER — CEFAZOLIN SODIUM-DEXTROSE 2-4 GM/100ML-% IV SOLN
INTRAVENOUS | Status: AC
  Filled 2022-11-16: qty 100

## 2022-11-16 MED ORDER — MIDAZOLAM HCL 2 MG/2ML IJ SOLN
INTRAMUSCULAR | Status: AC
  Filled 2022-11-16: qty 2

## 2022-11-16 MED ORDER — OXYCODONE HCL 5 MG/5ML PO SOLN: 5.0000 mg | Freq: Once | ORAL | Status: DC | PRN

## 2022-11-16 MED ORDER — SUGAMMADEX SODIUM 200 MG/2ML IV SOLN
INTRAVENOUS | Status: DC | PRN
  Administered 2022-11-16: 200 mg via INTRAVENOUS

## 2022-11-16 MED ORDER — NALOXONE HCL 0.4 MG/ML IJ SOLN: 0.4000 mg | INTRAMUSCULAR | Status: DC | PRN

## 2022-11-16 MED ORDER — IBUPROFEN 800 MG PO TABS: 800.0000 mg | ORAL_TABLET | Freq: Three times a day (TID) | ORAL | Status: DC | PRN

## 2022-11-16 MED ORDER — HYDROMORPHONE HCL 2 MG/ML IJ SOLN
INTRAMUSCULAR | Status: AC
  Filled 2022-11-16: qty 1

## 2022-11-16 MED ORDER — DEXAMETHASONE SODIUM PHOSPHATE 10 MG/ML IJ SOLN
INTRAMUSCULAR | Status: AC
  Filled 2022-11-16: qty 1

## 2022-11-16 MED ORDER — LIDOCAINE HCL (PF) 2 % IJ SOLN
INTRAMUSCULAR | Status: AC
  Filled 2022-11-16: qty 5

## 2022-11-16 MED ORDER — DEXAMETHASONE SODIUM PHOSPHATE 4 MG/ML IJ SOLN
INTRAMUSCULAR | Status: DC | PRN
  Administered 2022-11-16: 5 mg via INTRAVENOUS

## 2022-11-16 MED ORDER — ALUM & MAG HYDROXIDE-SIMETH 200-200-20 MG/5ML PO SUSP: 30.0000 mL | ORAL | Status: DC | PRN

## 2022-11-16 MED ORDER — SODIUM CHLORIDE 0.9 % IR SOLN
Status: DC | PRN
  Administered 2022-11-16: 200 mL via INTRAVESICAL

## 2022-11-16 MED ORDER — LIDOCAINE HCL (CARDIAC) PF 100 MG/5ML IV SOSY
PREFILLED_SYRINGE | INTRAVENOUS | Status: DC | PRN
  Administered 2022-11-16: 60 mg via INTRAVENOUS

## 2022-11-16 MED ORDER — ONDANSETRON HCL 4 MG/2ML IJ SOLN: 4.0000 mg | Freq: Four times a day (QID) | INTRAMUSCULAR | Status: DC | PRN

## 2022-11-16 MED ORDER — PROPOFOL 10 MG/ML IV BOLUS
INTRAVENOUS | Status: DC | PRN
  Administered 2022-11-16: 150 mg via INTRAVENOUS

## 2022-11-16 MED ORDER — FENTANYL CITRATE (PF) 100 MCG/2ML IJ SOLN
INTRAMUSCULAR | Status: DC | PRN
  Administered 2022-11-16: 150 ug via INTRAVENOUS
  Administered 2022-11-16: 100 ug via INTRAVENOUS
  Administered 2022-11-16 (×2): 50 ug via INTRAVENOUS

## 2022-11-16 MED ORDER — OXYCODONE-ACETAMINOPHEN 5-325 MG PO TABS: 1.0000 | ORAL_TABLET | Freq: Four times a day (QID) | ORAL | 0 refills | Status: DC | PRN

## 2022-11-16 MED ORDER — KETOROLAC TROMETHAMINE 30 MG/ML IJ SOLN
INTRAMUSCULAR | Status: AC
  Filled 2022-11-16: qty 1

## 2022-11-16 MED ORDER — KETOROLAC TROMETHAMINE 30 MG/ML IJ SOLN
INTRAMUSCULAR | Status: DC | PRN
  Administered 2022-11-16: 30 mg via INTRAVENOUS

## 2022-11-16 MED ORDER — PHENYLEPHRINE HCL (PRESSORS) 10 MG/ML IV SOLN
INTRAVENOUS | Status: DC | PRN
  Administered 2022-11-16 (×5): 80 ug via INTRAVENOUS

## 2022-11-16 MED ORDER — MENTHOL 3 MG MT LOZG: 1.0000 | LOZENGE | OROMUCOSAL | Status: DC | PRN

## 2022-11-16 MED ORDER — PROPOFOL 10 MG/ML IV BOLUS
INTRAVENOUS | Status: AC
  Filled 2022-11-16: qty 20

## 2022-11-16 MED ORDER — GUAIFENESIN 100 MG/5ML PO LIQD: 15.0000 mL | ORAL | Status: DC | PRN

## 2022-11-16 MED ORDER — CEFAZOLIN SODIUM-DEXTROSE 2-4 GM/100ML-% IV SOLN
2.0000 g | INTRAVENOUS | Status: AC
  Administered 2022-11-16: 2 g via INTRAVENOUS

## 2022-11-16 MED ORDER — DOCUSATE SODIUM 100 MG PO CAPS: 100.0000 mg | ORAL_CAPSULE | Freq: Two times a day (BID) | ORAL | 0 refills | Status: DC

## 2022-11-16 MED ORDER — DOCUSATE SODIUM 100 MG PO CAPS
100.0000 mg | ORAL_CAPSULE | Freq: Two times a day (BID) | ORAL | Status: DC
  Administered 2022-11-16: 100 mg via ORAL

## 2022-11-16 MED ORDER — HYDROMORPHONE HCL 1 MG/ML IJ SOLN: 0.2000 mg | INTRAMUSCULAR | Status: DC | PRN

## 2022-11-16 MED ORDER — ONDANSETRON HCL 4 MG PO TABS: 4.0000 mg | ORAL_TABLET | Freq: Three times a day (TID) | ORAL | Status: DC | PRN

## 2022-11-16 MED ORDER — ACETAMINOPHEN 500 MG PO TABS
1000.0000 mg | ORAL_TABLET | ORAL | Status: AC
  Administered 2022-11-16: 1000 mg via ORAL

## 2022-11-16 MED ORDER — FENTANYL CITRATE (PF) 250 MCG/5ML IJ SOLN
INTRAMUSCULAR | Status: AC
  Filled 2022-11-16: qty 5

## 2022-11-16 MED ORDER — DIPHENHYDRAMINE HCL 50 MG/ML IJ SOLN: 12.5000 mg | Freq: Four times a day (QID) | INTRAMUSCULAR | Status: DC | PRN

## 2022-11-16 MED ORDER — HYDROMORPHONE 1 MG/ML IV SOLN: INTRAVENOUS | Status: DC

## 2022-11-16 MED ORDER — DOCUSATE SODIUM 100 MG PO CAPS
ORAL_CAPSULE | ORAL | Status: AC
  Filled 2022-11-16: qty 1

## 2022-11-16 MED ORDER — MIDAZOLAM HCL 2 MG/2ML IJ SOLN: 0.5000 mg | Freq: Once | INTRAMUSCULAR | Status: DC | PRN

## 2022-11-16 MED ORDER — MIDAZOLAM HCL 5 MG/5ML IJ SOLN
INTRAMUSCULAR | Status: DC | PRN
  Administered 2022-11-16: 2 mg via INTRAVENOUS

## 2022-11-16 MED ORDER — GABAPENTIN 300 MG PO CAPS
300.0000 mg | ORAL_CAPSULE | ORAL | Status: AC
  Administered 2022-11-16: 300 mg via ORAL

## 2022-11-16 MED ORDER — SODIUM CHLORIDE 0.9% FLUSH: 9.0000 mL | INTRAVENOUS | Status: DC | PRN

## 2022-11-16 MED ORDER — OXYCODONE-ACETAMINOPHEN 5-325 MG PO TABS
1.0000 | ORAL_TABLET | ORAL | Status: DC | PRN
  Administered 2022-11-16: 2 via ORAL
  Administered 2022-11-16: 1 via ORAL

## 2022-11-16 MED ORDER — MEPERIDINE HCL 25 MG/ML IJ SOLN: 6.2500 mg | INTRAMUSCULAR | Status: DC | PRN

## 2022-11-16 MED ORDER — ONDANSETRON HCL 4 MG/2ML IJ SOLN
INTRAMUSCULAR | Status: DC | PRN
  Administered 2022-11-16: 4 mg via INTRAVENOUS

## 2022-11-16 MED ORDER — HYDROMORPHONE HCL 1 MG/ML IJ SOLN
INTRAMUSCULAR | Status: DC | PRN
  Administered 2022-11-16: 1 mg via INTRAVENOUS

## 2022-11-16 MED ORDER — ALBUTEROL SULFATE HFA 108 (90 BASE) MCG/ACT IN AERS: 1.0000 | INHALATION_SPRAY | Freq: Four times a day (QID) | RESPIRATORY_TRACT | Status: DC | PRN

## 2022-11-16 MED ORDER — ROCURONIUM BROMIDE 100 MG/10ML IV SOLN
INTRAVENOUS | Status: DC | PRN
  Administered 2022-11-16: 10 mg via INTRAVENOUS
  Administered 2022-11-16: 60 mg via INTRAVENOUS
  Administered 2022-11-16: 10 mg via INTRAVENOUS
  Administered 2022-11-16: 20 mg via INTRAVENOUS

## 2022-11-16 MED ORDER — DIPHENHYDRAMINE HCL 12.5 MG/5ML PO ELIX: 12.5000 mg | ORAL_SOLUTION | Freq: Four times a day (QID) | ORAL | Status: DC | PRN

## 2022-11-16 MED ORDER — SIMETHICONE 80 MG PO CHEW: 80.0000 mg | CHEWABLE_TABLET | Freq: Four times a day (QID) | ORAL | Status: DC | PRN

## 2022-11-16 MED ORDER — IBUPROFEN 800 MG PO TABS: 800.0000 mg | ORAL_TABLET | Freq: Three times a day (TID) | ORAL | 1 refills | Status: DC | PRN

## 2022-11-16 MED ORDER — PROMETHAZINE HCL 25 MG/ML IJ SOLN: 6.2500 mg | INTRAMUSCULAR | Status: DC | PRN

## 2022-11-16 MED ORDER — PHENYLEPHRINE 80 MCG/ML (10ML) SYRINGE FOR IV PUSH (FOR BLOOD PRESSURE SUPPORT)
PREFILLED_SYRINGE | INTRAVENOUS | Status: AC
  Filled 2022-11-16: qty 10

## 2022-11-16 MED ORDER — FENTANYL CITRATE (PF) 100 MCG/2ML IJ SOLN
INTRAMUSCULAR | Status: AC
  Filled 2022-11-16: qty 2

## 2022-11-16 MED ORDER — GABAPENTIN 300 MG PO CAPS
ORAL_CAPSULE | ORAL | Status: AC
  Filled 2022-11-16: qty 1

## 2022-11-16 MED ORDER — HYDROMORPHONE HCL 1 MG/ML IJ SOLN: 0.2500 mg | INTRAMUSCULAR | Status: DC | PRN

## 2022-11-16 MED ORDER — SODIUM CHLORIDE 0.9 % IR SOLN
Status: DC | PRN
  Administered 2022-11-16: 300 mL

## 2022-11-16 SURGICAL SUPPLY — 78 items
ADH SKN CLS APL DERMABOND .7 (GAUZE/BANDAGES/DRESSINGS) ×2
APL SRG 38 LTWT LNG FL B (MISCELLANEOUS)
APPLICATOR ARISTA FLEXITIP XL (MISCELLANEOUS) IMPLANT
CANNULA CAP OBTURATR AIRSEAL 8 (CAP) ×2 IMPLANT
CANNULA REDUC XI 12-8 STAPL (CANNULA)
CANNULA REDUCER 12-8 DVNC XI (CANNULA) IMPLANT
CATH FOLEY 3WAY  5CC 16FR (CATHETERS) ×2
CATH FOLEY 3WAY 5CC 16FR (CATHETERS) ×2 IMPLANT
CELLS DAT CNTRL 66122 CELL SVR (MISCELLANEOUS) IMPLANT
COVER BACK TABLE 60X90IN (DRAPES) ×2 IMPLANT
COVER TIP SHEARS 8 DVNC (MISCELLANEOUS) ×2 IMPLANT
COVER TIP SHEARS 8MM DA VINCI (MISCELLANEOUS) ×2
DEFOGGER SCOPE WARMER CLEARIFY (MISCELLANEOUS) ×2 IMPLANT
DERMABOND ADVANCED .7 DNX12 (GAUZE/BANDAGES/DRESSINGS) ×2 IMPLANT
DRAPE ARM DVNC X/XI (DISPOSABLE) ×8 IMPLANT
DRAPE COLUMN DVNC XI (DISPOSABLE) ×2 IMPLANT
DRAPE DA VINCI XI ARM (DISPOSABLE) ×8
DRAPE DA VINCI XI COLUMN (DISPOSABLE) ×2
DRAPE SURG IRRIG POUCH 19X23 (DRAPES) ×2 IMPLANT
DRAPE UTILITY XL STRL (DRAPES) ×2 IMPLANT
DURAPREP 26ML APPLICATOR (WOUND CARE) ×2 IMPLANT
ELECT REM PT RETURN 9FT ADLT (ELECTROSURGICAL) ×2
ELECTRODE REM PT RTRN 9FT ADLT (ELECTROSURGICAL) ×2 IMPLANT
GAUZE 4X4 16PLY ~~LOC~~+RFID DBL (SPONGE) ×2 IMPLANT
GLOVE BIO SURGEON STRL SZ 6.5 (GLOVE) ×6 IMPLANT
HEMOSTAT ARISTA ABSORB 3G PWDR (HEMOSTASIS) IMPLANT
HOLDER FOLEY CATH W/STRAP (MISCELLANEOUS) IMPLANT
IRRIG SUCT STRYKERFLOW 2 WTIP (MISCELLANEOUS) ×2
IRRIGATION SUCT STRKRFLW 2 WTP (MISCELLANEOUS) ×2 IMPLANT
IV NS 1000ML (IV SOLUTION) ×4
IV NS 1000ML BAXH (IV SOLUTION) IMPLANT
KIT PINK PAD W/HEAD ARE REST (MISCELLANEOUS) ×2
KIT PINK PAD W/HEAD ARM REST (MISCELLANEOUS) ×2 IMPLANT
KIT TURNOVER CYSTO (KITS) ×2 IMPLANT
LEGGING LITHOTOMY PAIR STRL (DRAPES) ×2 IMPLANT
MANIFOLD NEPTUNE II (INSTRUMENTS) ×2 IMPLANT
MANIPULATOR ADVINCU DEL 2.5 PL (MISCELLANEOUS) IMPLANT
MANIPULATOR ADVINCU DEL 3.0 PL (MISCELLANEOUS) IMPLANT
MANIPULATOR ADVINCU DEL 3.5 PL (MISCELLANEOUS) IMPLANT
MANIPULATOR ADVINCU DEL 4.0 PL (MISCELLANEOUS) IMPLANT
NDL INSUFFLATION 14GA 120MM (NEEDLE) ×2 IMPLANT
NEEDLE INSUFFLATION 14GA 120MM (NEEDLE) ×2 IMPLANT
OBTURATOR OPTICAL STANDARD 8MM (TROCAR) ×2
OBTURATOR OPTICAL STND 8 DVNC (TROCAR) ×2
OBTURATOR OPTICALSTD 8 DVNC (TROCAR) ×2 IMPLANT
OCCLUDER COLPOPNEUMO (BALLOONS) ×2 IMPLANT
PACK ROBOT WH (CUSTOM PROCEDURE TRAY) ×2 IMPLANT
PACK ROBOTIC GOWN (GOWN DISPOSABLE) ×2 IMPLANT
PAD OB MATERNITY 4.3X12.25 (PERSONAL CARE ITEMS) ×2 IMPLANT
PAD PREP 24X48 CUFFED NSTRL (MISCELLANEOUS) ×2 IMPLANT
PROTECTOR NERVE ULNAR (MISCELLANEOUS) ×2 IMPLANT
RETRACTOR WND ALEXIS 18 MED (MISCELLANEOUS) IMPLANT
RTRCTR WOUND ALEXIS 18CM MED (MISCELLANEOUS)
RTRCTR WOUND ALEXIS 18CM SML (INSTRUMENTS)
SAVER CELL AAL HAEMONETICS (INSTRUMENTS) IMPLANT
SEAL UNIV 5-12 XI (MISCELLANEOUS) ×4 IMPLANT
SEAL XI UNIVERSAL 5-12 (MISCELLANEOUS) ×4
SEALER VESSEL DA VINCI XI (MISCELLANEOUS)
SEALER VESSEL EXT DVNC XI (MISCELLANEOUS) IMPLANT
SET IRRIG Y TYPE TUR BLADDER L (SET/KITS/TRAYS/PACK) IMPLANT
SET TUBE FILTERED XL AIRSEAL (SET/KITS/TRAYS/PACK) ×2 IMPLANT
SLEEVE SCD COMPRESS KNEE MED (STOCKING) ×2 IMPLANT
SPIKE FLUID TRANSFER (MISCELLANEOUS) ×4 IMPLANT
SUT VIC AB 0 CT1 27 (SUTURE)
SUT VIC AB 0 CT1 27XBRD ANBCTR (SUTURE) IMPLANT
SUT VIC AB 4-0 PS2 18 (SUTURE) ×6 IMPLANT
SUT VICRYL 0 UR6 27IN ABS (SUTURE) IMPLANT
SUT VLOC 180 0 9IN  GS21 (SUTURE) ×2
SUT VLOC 180 0 9IN GS21 (SUTURE) ×2 IMPLANT
TIP RUMI ORANGE 6.7MMX12CM (TIP) IMPLANT
TIP UTERINE 5.1X6CM LAV DISP (MISCELLANEOUS) IMPLANT
TIP UTERINE 6.7X10CM GRN DISP (MISCELLANEOUS) IMPLANT
TIP UTERINE 6.7X6CM WHT DISP (MISCELLANEOUS) IMPLANT
TIP UTERINE 6.7X8CM BLUE DISP (MISCELLANEOUS) IMPLANT
TOWEL OR 17X24 6PK STRL BLUE (TOWEL DISPOSABLE) ×2 IMPLANT
TROCAR PORT AIRSEAL 8X100 (TROCAR) IMPLANT
WATER STERILE IRR 1000ML POUR (IV SOLUTION) ×2 IMPLANT
WATER STERILE IRR 500ML POUR (IV SOLUTION) IMPLANT

## 2022-11-16 NOTE — Op Note (Signed)
NAME: Sheri Flores, Sheri Flores MEDICAL RECORD NO: [id number] ACCOUNT NO: [id number] DATE OF BIRTH: 1976-08-13 FACILITY: [facility] LOCATION: [facility] PHYSICIAN: Sherian Rein, MD  Operative Report   DATE OF PROCEDURE: 11/16/2022  PREOPERATIVE DIAGNOSIS:  Uterine adenomyosis, pelvic pain.  POSTOPERATIVE DIAGNOSIS:  Uterine adenomyosis, pelvic pain, status post hysterectomy.  PROCEDURE:  da Vinci Robot-assisted total laparoscopic hysterectomy with bilateral salpingectomy and cystoscopy.  SURGEON:  Sherian Rein, MD  ASSISTANT:  Webb Silversmith, RNFA; Vonzell Schlatter, FNP.  ANESTHESIA:  Local and general.  ESTIMATED BLOOD LOSS:  100 mL.  IV FLUIDS AND URINE OUTPUT:  Per anesthesia.  COMPLICATIONS:  Uterus perforation by Advincula manipulator.  DESCRIPTION OF PROCEDURE:  After informed consent was reviewed with the patient including risks, benefits and alternatives of surgical procedure she was transported to the operating room, placed on the table in supine position.  General anesthesia was  then induced and found to be adequate.  She was then placed in the [place] stirrups, prepped and draped in the normal sterile fashion.  Foley catheter was placed in her bladder sterilely.  An Advincula uterine manipulator was placed into her uterus.   The uterus had been sounded to 8 cm prior.  The gloves and gowns were changed.  Attention was turned to the abdominal portion of the case.  The umbilical incision was made first.  The fascia was cleared off with a hemostat and using a Veress needle,  pneumoperitoneum was obtained after passing the hanging drop test with an opening pressure of 2 mmHg.  After pneumoperitoneum of 3 liters of air, under direct visualization the port was placed at her umbilicus.  Accessory ports were  placed on the right and left under direct visualization.  The Advincula was noticed to be coming out. Her cervix had been flushed to the vagina.  It was difficult to get  the cup around the cervix.  The hands were washed again, attention was turned back  to the docking of the robot.  The robot was docked and attention was turned to the physician was seated at the console.  The tube and ovary were adhesed to the abdominal wall.  This was lysed using the vessel sealer.  The fimbriated end of the tube was  grasped with the fenestrated bipolar and separated to pass the level of the ovary with the vessel sealer. The round ligament was then ligated and the cardinal ligament was ligated as well with the vessel sealer. The infundibulopelvic ligament was also  lysed.  The uterine artery was skeletonized on the left side.  The bladder flap was created.  Attention was then turned to the right side, which, in a similar fashion, the fimbriated end of the tube was grasped and to the level of the ovary.  The tube  was excised and ligating the infundibulopelvic the round ligament was incised and the cardinal ligaments were pedicalized to the level of the uterine artery.  The uterine artery was skeletonized.  Bladder flap was connected from right to left.  The  uterine arteries were ligated bilaterally.  The scissors were used to create a colpotomy. Colpotomy was used around the entire cervix.  The uterus was delivered.  The uterine cuff was closed with looped suture back sewn over itself.  Irrigation was  performed.  There was no active bleeding.  The cuff was visualized from the vagina and found to be intact. A cystoscopy was performed as one ureter was inconsistantly identified.  There was jets seen from bilateral ureters.  The  ports were closed with 4-0  Vicryl and Dermabond.  The patient tolerated the procedure well.  Sponge, lap, and needle count was correct x2 per the operating staff. The patient was awakened in stable condition, transferred to the PACU.   PUS D: 11/16/2022 10:31:20 am T: 11/16/2022 1:33:00 pm  JOB: [id number]/ [id number]

## 2022-11-16 NOTE — Discharge Summary (Signed)
Physician Discharge Summary  Patient ID: Sheri Flores MRN: [id number] DOB/AGE: 47-May-1977 47 y.o.  Admit date: 11/16/2022 Discharge date: 11/16/2022  Admission Diagnoses: uterine adenomyosis, pelvic pain  Discharge Diagnoses: uterine adenomyosis, pelvic pain Principal Problem:   S/P robot-assisted surgical procedure   Discharged Condition: good  Hospital Course: admitted for RA TLH/BSO given adenomyosis and pelvic pain.  Underwent procedure without complication.  Labs stable.  Ambulating, voiding, tolerating po, pain controlled  Consults: None  Significant Diagnostic Studies: labs: CBC, BMP  Treatments: surgery: RA TLH, BSO  Discharge Exam: Blood pressure 113/85, pulse (!) 101, temperature 97.6 F (36.4 C), resp. rate 16, height 5\' 4"  (1.626 m), weight 54.6 kg, SpO2 97 %. General appearance: alert and no distress Resp: clear to auscultation bilaterally Cardio: regular rate and rhythm GI: soft, non-tender; bowel sounds normal; no masses,  no organomegaly Extremities: extremities normal, atraumatic, no cyanosis or edema Incision/Wound: C/D/I  Disposition: Discharge disposition: 01-Home or Self Care       Discharge Instructions     Call MD for:  persistant nausea and vomiting   Complete by: As directed    Call MD for:  redness, tenderness, or signs of infection (pain, swelling, redness, odor or green/yellow discharge around incision site)   Complete by: As directed    Call MD for:  severe uncontrolled pain   Complete by: As directed    Diet - low sodium heart healthy   Complete by: As directed    Discharge instructions   Complete by: As directed    Call [phone] with questions or problems   Driving Restrictions   Complete by: As directed    While taking strong pain medicine   Increase activity slowly   Complete by: As directed    Lifting restrictions   Complete by: As directed    No greater than 10-15lbs for 6 weeks   May shower / Bathe   Complete by: As  directed    May walk up steps   Complete by: As directed    Sexual Activity Restrictions   Complete by: As directed    Pelvic rest - no douching, tampons or sex for 6 weeks      Allergies as of 11/16/2022       Reactions   Aspartame Swelling   Aspirin    Gi upset with larger doses   Hydrocodone Itching   Trazodone And Nefazodone Other (See Comments)   nightmares        Medication List     STOP taking these medications    Slynd 4 MG Tabs Generic drug: Drospirenone       TAKE these medications    albuterol 108 (90 Base) MCG/ACT inhaler Commonly known as: VENTOLIN HFA INHALE 2 PUFFS INTO THE LUNGS EVERY 6 HOURS AS NEEDED FOR WHEEZING OR SHORTNESS OF BREATH What changed: how much to take   aspirin 81 MG chewable tablet Chew 81 mg by mouth daily.   [employer] B12 Gummies 500 MCG Chew Generic drug: Cyanocobalamin Chew 2 tablets by mouth daily.   docusate sodium 100 MG capsule Commonly known as: COLACE Take 1 capsule (100 mg total) by mouth 2 (two) times daily.   ibuprofen 800 MG tablet Commonly known as: ADVIL Take 1 tablet (800 mg total) by mouth every 8 (eight) hours as needed for moderate pain (mild pain).   mometasone 50 MCG/ACT nasal spray Commonly known as: NASONEX Place 2 sprays into the nose as needed.   ondansetron 4 MG tablet Commonly  known as: ZOFRAN Take 4 mg by mouth every 8 (eight) hours as needed for nausea or vomiting.   oxyCODONE-acetaminophen 5-325 MG tablet Commonly known as: PERCOCET/ROXICET Take 1-2 tablets by mouth every 6 (six) hours as needed for severe pain (moderate to severe pain (when tolerating fluids)).        Follow-up Information     Bovard-Stuckert, Quency Tober, MD. Schedule an appointment as soon as possible for a visit in 2 week(s).   Specialty: Obstetrics and Gynecology Why: 2 weeks and 6 weeks - for postop care Contact information: [address] [place] [address] [phone]                  Signed: Janyth Contes 11/16/2022, 5:04 PM

## 2022-11-16 NOTE — Anesthesia Procedure Notes (Signed)
Procedure Name: Intubation Date/Time: 11/16/2022 7:38 AM  Performed by: Justice Rocher, CRNAPre-anesthesia Checklist: Patient identified, Emergency Drugs available, Suction available, Patient being monitored and Timeout performed Patient Re-evaluated:Patient Re-evaluated prior to induction Oxygen Delivery Method: Circle system utilized Preoxygenation: Pre-oxygenation with 100% oxygen Induction Type: IV induction Ventilation: Mask ventilation without difficulty Laryngoscope Size: Mac and 3 Grade View: Grade I Tube type: Oral Tube size: 7.0 mm Number of attempts: 1 Airway Equipment and Method: Stylet and Oral airway Placement Confirmation: ETT inserted through vocal cords under direct vision, positive ETCO2, breath sounds checked- equal and bilateral and CO2 detector Secured at: 23 cm Tube secured with: Tape Dental Injury: Teeth and Oropharynx as per pre-operative assessment

## 2022-11-16 NOTE — Interval H&P Note (Signed)
History and Physical Interval Note:  11/16/2022 7:09 AM  Sheri Flores  has presented today for surgery, with the diagnosis of uterine adenomyosis.  The various methods of treatment have been discussed with the patient and family. After consideration of risks, benefits and other options for treatment, the patient has consented to  Procedure(s) with comments: XI ROBOTIC ASSISTED TOTAL HYSTERECTOMY WITH BILATERAL SALPINGO OOPHORECTOMY (Bilateral) - [name] to RNFA.  confirmed on 2/20 CS and KB POSSIBLE CYSTOSCOPY (N/A) as a surgical intervention.  The patient's history has been reviewed, patient examined, no change in status, stable for surgery.  I have reviewed the patient's chart and labs.  Questions were answered to the patient's satisfaction.     [name]

## 2022-11-16 NOTE — Transfer of Care (Signed)
Immediate Anesthesia Transfer of Care Note  Patient: Sheri Flores  Procedure(s) Performed: Procedure(s) (LRB): XI ROBOTIC ASSISTED TOTAL HYSTERECTOMY WITH BILATERAL SALPINGO OOPHORECTOMY (Bilateral) CYSTOSCOPY (N/A)  Patient Location: PACU  Anesthesia Type: General  Level of Consciousness: awake, sedated, patient cooperative and responds to stimulation  Airway & Oxygen Therapy: Patient Spontanous Breathing and Patient connected to [place] oxygen  Post-op Assessment: Report given to PACU RN, Post -op Vital signs reviewed and stable and Patient moving all extremities  Post vital signs: Reviewed and stable  Complications: No apparent anesthesia complications

## 2022-11-16 NOTE — Anesthesia Postprocedure Evaluation (Signed)
Anesthesia Post Note  Patient: SOLAE YAFFE  Procedure(s) Performed: XI ROBOTIC ASSISTED TOTAL HYSTERECTOMY WITH BILATERAL SALPINGO OOPHORECTOMY (Bilateral: Abdomen) CYSTOSCOPY (Bladder)     Patient location during evaluation: PACU Anesthesia Type: General Level of consciousness: patient cooperative, oriented and sedated Pain management: pain level controlled Vital Signs Assessment: post-procedure vital signs reviewed and stable Respiratory status: spontaneous breathing, nonlabored ventilation and respiratory function stable Cardiovascular status: blood pressure returned to baseline and stable Postop Assessment: no apparent nausea or vomiting Anesthetic complications: no   No notable events documented.  Last Vitals:  Vitals:   11/16/22 1117 11/16/22 1125  BP:    Pulse: (!) 107 97  Resp: 16 14  Temp:    SpO2: 92% 93%    Last Pain:  Vitals:   11/16/22 1125  TempSrc:   PainSc: 0-No pain                 Malaki Koury,E. Haidan Nhan

## 2022-11-16 NOTE — Progress Notes (Signed)
Day of Surgery Procedure(s) (LRB): XI ROBOTIC ASSISTED TOTAL HYSTERECTOMY WITH BILATERAL SALPINGO OOPHORECTOMY (Bilateral) CYSTOSCOPY (N/A)  Subjective: Patient reports tolerating PO, + flatus, and no problems voiding.  Pain controlled.  Anxious for d/c home.  Hgb and Cr stable.    Objective: I have reviewed patient's vital signs, intake and output, medications, and labs.  General: alert and no distress Resp: clear to auscultation bilaterally Cardio: regular rate and rhythm GI: soft, non-tender; bowel sounds normal; no masses,  no organomegaly and incision: clean, dry, and intact Extremities: extremities normal, atraumatic, no cyanosis or edema Vaginal Bleeding: minimal  Assessment: s/p Procedure(s) with comments: XI ROBOTIC ASSISTED TOTAL HYSTERECTOMY WITH BILATERAL SALPINGO OOPHORECTOMY (Bilateral) - Amalya to RNFA.  confirmed on 2/20 CS and KB CYSTOSCOPY (N/A): stable and progressing well  Plan: Encourage ambulation Discharge home, pt given d/c instructions - regarding lifting, pelvic rest, driving; also ambulating.  Reasons to call. F/U 2 and 6 weeks  LOS: 0 days    Janyth Contes, MD 11/16/2022, 4:56 PM

## 2022-11-16 NOTE — Brief Op Note (Signed)
11/16/2022  10:22 AM  PATIENT:  Sheri Flores  47 y.o. female  PRE-OPERATIVE DIAGNOSIS:  uterine adenomyosis, pelvic pain  POST-OPERATIVE DIAGNOSIS:  uterine adenomyosis, pelvic pain  PROCEDURE:  Procedure(s) with comments: XI ROBOTIC ASSISTED TOTAL HYSTERECTOMY WITH BILATERAL SALPINGO OOPHORECTOMY (Bilateral) - Trinna to RNFA.  confirmed on 2/20 CS and KB CYSTOSCOPY (N/A)  SURGEON:  Surgeon(s) and Role:    * Bovard-Stuckert, Jesus Nevills, MD - Primary  ASSISTANTS: Serafina Royals RNFA. Mnnn, Natalya FNP  ANESTHESIA:   local and general  EBL:  100 mL IVF and uop per anesthesia  BLOOD ADMINISTERED:none  DRAINS: Urinary Catheter (Foley)   LOCAL MEDICATIONS USED:  MARCAINE     SPECIMEN:  Source of Specimen:  uterus, cervix, B fallopian tubes and ovaries  DISPOSITION OF SPECIMEN:  PATHOLOGY  COUNTS:  YES  TOURNIQUET:  * No tourniquets in log *  DICTATION: .Other Dictation: Dictation Number [id number]  PLAN OF CARE:  extended recovery  PATIENT DISPOSITION:  PACU - hemodynamically stable.   Delay start of Pharmacological VTE agent (>24hrs) due to surgical blood loss or risk of bleeding: not applicable

## 2022-11-17 ENCOUNTER — Encounter (HOSPITAL_BASED_OUTPATIENT_CLINIC_OR_DEPARTMENT_OTHER): Payer: Self-pay | Admitting: Obstetrics and Gynecology

## 2022-11-18 LAB — SURGICAL PATHOLOGY

## 2023-01-15 ENCOUNTER — Ambulatory Visit: Payer: Self-pay | Admitting: Nurse Practitioner

## 2023-01-20 ENCOUNTER — Encounter: Payer: Self-pay | Admitting: Family Medicine

## 2023-01-20 ENCOUNTER — Ambulatory Visit (INDEPENDENT_AMBULATORY_CARE_PROVIDER_SITE_OTHER): Payer: 59 | Admitting: Family Medicine

## 2023-01-20 VITALS — BP 116/72 | HR 88 | Temp 97.6°F | Ht 64.0 in | Wt 123.0 lb

## 2023-01-20 DIAGNOSIS — F411 Generalized anxiety disorder: Secondary | ICD-10-CM

## 2023-01-20 DIAGNOSIS — R7989 Other specified abnormal findings of blood chemistry: Secondary | ICD-10-CM

## 2023-01-20 DIAGNOSIS — F410 Panic disorder [episodic paroxysmal anxiety] without agoraphobia: Secondary | ICD-10-CM

## 2023-01-20 DIAGNOSIS — E559 Vitamin D deficiency, unspecified: Secondary | ICD-10-CM | POA: Diagnosis not present

## 2023-01-20 DIAGNOSIS — G4700 Insomnia, unspecified: Secondary | ICD-10-CM

## 2023-01-20 DIAGNOSIS — E039 Hypothyroidism, unspecified: Secondary | ICD-10-CM | POA: Diagnosis not present

## 2023-01-20 DIAGNOSIS — H6012 Cellulitis of left external ear: Secondary | ICD-10-CM

## 2023-01-20 DIAGNOSIS — R5383 Other fatigue: Secondary | ICD-10-CM

## 2023-01-20 DIAGNOSIS — E7800 Pure hypercholesterolemia, unspecified: Secondary | ICD-10-CM | POA: Diagnosis not present

## 2023-01-20 DIAGNOSIS — Z7689 Persons encountering health services in other specified circumstances: Secondary | ICD-10-CM

## 2023-01-20 LAB — CBC WITH DIFFERENTIAL/PLATELET
Basophils Absolute: 0.1 10*3/uL (ref 0.0–0.1)
Basophils Relative: 0.8 % (ref 0.0–3.0)
Eosinophils Absolute: 0.2 10*3/uL (ref 0.0–0.7)
Eosinophils Relative: 3.6 % (ref 0.0–5.0)
HCT: 43.2 % (ref 36.0–46.0)
Hemoglobin: 14.3 g/dL (ref 12.0–15.0)
Lymphocytes Relative: 24.6 % (ref 12.0–46.0)
Lymphs Abs: 1.7 10*3/uL (ref 0.7–4.0)
MCHC: 33.1 g/dL (ref 30.0–36.0)
MCV: 94.5 fl (ref 78.0–100.0)
Monocytes Absolute: 0.6 10*3/uL (ref 0.1–1.0)
Monocytes Relative: 9.4 % (ref 3.0–12.0)
Neutro Abs: 4.2 10*3/uL (ref 1.4–7.7)
Neutrophils Relative %: 61.6 % (ref 43.0–77.0)
Platelets: 312 10*3/uL (ref 150.0–400.0)
RBC: 4.57 Mil/uL (ref 3.87–5.11)
RDW: 12.8 % (ref 11.5–15.5)
WBC: 6.9 10*3/uL (ref 4.0–10.5)

## 2023-01-20 LAB — COMPREHENSIVE METABOLIC PANEL
ALT: 10 U/L (ref 0–35)
AST: 13 U/L (ref 0–37)
Albumin: 4.1 g/dL (ref 3.5–5.2)
Alkaline Phosphatase: 86 U/L (ref 39–117)
BUN: 13 mg/dL (ref 6–23)
CO2: 26 mEq/L (ref 19–32)
Calcium: 9.3 mg/dL (ref 8.4–10.5)
Chloride: 104 mEq/L (ref 96–112)
Creatinine, Ser: 0.78 mg/dL (ref 0.40–1.20)
GFR: 90.89 mL/min (ref >60.00)
Glucose, Bld: 77 mg/dL (ref 70–99)
Potassium: 3.7 mEq/L (ref 3.5–5.1)
Sodium: 143 mEq/L (ref 135–145)
Total Bilirubin: 0.3 mg/dL (ref 0.2–1.2)
Total Protein: 7 g/dL (ref 6.0–8.3)

## 2023-01-20 LAB — LIPID PANEL
Cholesterol: 188 mg/dL (ref 0–200)
HDL: 51.1 mg/dL (ref >39.00)
LDL Cholesterol: 107 mg/dL — ABNORMAL HIGH (ref 0–99)
NonHDL: 137.03
Total CHOL/HDL Ratio: 4
Triglycerides: 152 mg/dL — ABNORMAL HIGH (ref 0.0–149.0)
VLDL: 30.4 mg/dL (ref 0.0–40.0)

## 2023-01-20 LAB — T4, FREE: Free T4: 0.84 ng/dL (ref 0.60–1.60)

## 2023-01-20 LAB — TSH: TSH: 0.33 u[IU]/mL — ABNORMAL LOW (ref 0.35–5.50)

## 2023-01-20 LAB — VITAMIN B12: Vitamin B-12: 274 pg/mL (ref 211–911)

## 2023-01-20 LAB — VITAMIN D 25 HYDROXY (VIT D DEFICIENCY, FRACTURES): VITD: 11.25 ng/mL — ABNORMAL LOW (ref 30.00–100.00)

## 2023-01-20 MED ORDER — MUPIROCIN 2 % EX OINT
1.0000 | TOPICAL_OINTMENT | Freq: Two times a day (BID) | CUTANEOUS | 0 refills | Status: DC
Start: 2023-01-20 — End: 2023-07-08

## 2023-01-20 MED ORDER — FLUOXETINE HCL 10 MG PO CAPS
10.0000 mg | ORAL_CAPSULE | Freq: Every day | ORAL | 1 refills | Status: DC
Start: 2023-01-20 — End: 2023-07-08

## 2023-01-20 NOTE — Progress Notes (Unsigned)
New Patient Office Visit  Subjective    Patient ID: Sheri Flores, female    DOB: April 02, 1976  Age: 47 y.o. MRN: [id number]  CC:  Chief Complaint  Patient presents with   Establish Care    Would like to have both ears looked at, dog likes to lick her ear and is concerned about bacteria.   Having a lot of panic attacks recently and anxiety    HPI [name] presents to establish care Previous PCP in [place].   OB/GYN- Dr. Layne Benton  Hysterectomy in April 2024 for benign.  Taking estrogen patches.   States she had an appt with a therapist but had to cancel.   Panic attacks and anxiety. Worse recently.  States she used to take Prozac. Ran out of it.   States her mood swings are bad.  Denies SI.    Manages with breathing and meditation.   Reports feeling tired all the time. Headaches.    Denies alcohol use.  Takes Delta gummies for sleep at times.  Smokes- since age 30. 1 pack per 3 days.   Married Art gallery manager.            01/20/2023    1:03 PM 01/05/2019    3:03 PM 11/30/2018    3:00 PM 11/21/2018    3:26 PM 09/26/2018   10:39 AM  Depression screen PHQ 2/9  Decreased Interest 0 0 0 0 0  Down, Depressed, Hopeless 0 0 0 0 1  PHQ - 2 Score 0 0 0 0 1  Altered sleeping  0 1 1 3   Tired, decreased energy  0 0 1 0  Change in appetite  0 0 0 1  Feeling bad or failure about yourself   0 0 0 0  Trouble concentrating  0 0 0 0  Moving slowly or fidgety/restless  0 1 0 0  Suicidal thoughts  0 0 0 0  PHQ-9 Score  0 2 2 5   Difficult doing work/chores   Not difficult at all Somewhat difficult Not difficult at all       Outpatient Encounter Medications as of 01/20/2023  Medication Sig   albuterol (VENTOLIN HFA) 108 (90 Base) MCG/ACT inhaler INHALE 2 PUFFS INTO THE LUNGS EVERY 6 HOURS AS NEEDED FOR WHEEZING OR SHORTNESS OF BREATH (Patient taking differently: Inhale 1-2 puffs into the lungs every 6 (six) hours as needed for wheezing or shortness of breath.)    aspirin 81 MG chewable tablet Chew 81 mg by mouth daily.   Cyanocobalamin ([employer] B12 GUMMIES) 500 MCG CHEW Chew 2 tablets by mouth daily.   docusate sodium (COLACE) 100 MG capsule Take 1 capsule (100 mg total) by mouth 2 (two) times daily.   FLUoxetine (PROZAC) 10 MG capsule Take 1 capsule (10 mg total) by mouth daily.   mometasone (NASONEX) 50 MCG/ACT nasal spray Place 2 sprays into the nose as needed.   mupirocin ointment (BACTROBAN) 2 % Apply 1 Application topically 2 (two) times daily.   ondansetron (ZOFRAN) 4 MG tablet Take 4 mg by mouth every 8 (eight) hours as needed for nausea or vomiting.   Vitamin D, Ergocalciferol, (DRISDOL) 1.25 MG (50000 UNIT) CAPS capsule Take 1 capsule (50,000 Units total) by mouth every 7 (seven) days.   [DISCONTINUED] ibuprofen (ADVIL) 800 MG tablet Take 1 tablet (800 mg total) by mouth every 8 (eight) hours as needed for moderate pain (mild pain).   [DISCONTINUED] oxyCODONE-acetaminophen (PERCOCET/ROXICET) 5-325 MG tablet Take 1-2 tablets by mouth  every 6 (six) hours as needed for severe pain (moderate to severe pain (when tolerating fluids)). (Patient not taking: Reported on 01/20/2023)   No facility-administered encounter medications on file as of 01/20/2023.    Past Medical History:  Diagnosis Date   Adenomyosis of uterus    ADHD (attention deficit hyperactivity disorder)    Complication of anesthesia    "woke up in middle of tubal and colonscopy"   GAD (generalized anxiety disorder)    Hemorrhoids    History of gastritis 09/2018   History of Helicobacter pylori infection 09/2018   per EGD bx ;   completed treatment   MDD (major depressive disorder)    Mild asthma    Pelvic pain     Past Surgical History:  Procedure Laterality Date   COLONOSCOPY WITH ESOPHAGOGASTRODUODENOSCOPY (EGD)  10/10/2018   dr Myrtie Neither   CYSTOSCOPY N/A 11/16/2022   Procedure: CYSTOSCOPY;  Surgeon: Sherian Rein, MD;  Location: [facility];  Service:  Gynecology;  Laterality: N/A;   ROBOTIC ASSISTED TOTAL HYSTERECTOMY WITH BILATERAL SALPINGO OOPHERECTOMY Bilateral 11/16/2022   Procedure: XI ROBOTIC ASSISTED TOTAL HYSTERECTOMY WITH BILATERAL SALPINGO OOPHORECTOMY;  Surgeon: Sherian Rein, MD;  Location: [facility];  Service: Gynecology;  Laterality: Bilateral;  [place] to RNFA.  confirmed on 2/20 CS and KB   TONSILLECTOMY     age 60   TUBAL LIGATION Bilateral 2000   PPTL    Family History  Problem Relation Age of Onset   Breast cancer Paternal Grandmother 19   Breast cancer Maternal Grandmother    Colon cancer Paternal Grandfather    Rectal cancer Neg Hx    Stomach cancer Neg Hx    Esophageal cancer Neg Hx     Social History   Socioeconomic History   Marital status: Married    Spouse name: Not on file   Number of children: 3   Years of education: Not on file   Highest education level: Not on file  Occupational History   Occupation: stay at home  Tobacco Use   Smoking status: Every Day    Years: 29    Types: Cigarettes   Smokeless tobacco: Never   Tobacco comments:    11-11-2022   per pt trying to quit,  wast smoking 1/2 ppd,  down to yesterday smoked 3 cig  Vaping Use   Vaping Use: Never used  Substance and Sexual Activity   Alcohol use: Not Currently    Comment: socially   Drug use: Not Currently    Types: Marijuana    Comment: 11-11-2022  per pt last smokes yrs ago   Sexual activity: Yes    Birth control/protection: Surgical    Comment: 1st intercourse 66 yo-5 partners  Other Topics Concern   Not on file  Social History Narrative   Not on file   Social Determinants of Health   Financial Resource Strain: Not on file  Food Insecurity: Not on file  Transportation Needs: Not on file  Physical Activity: Not on file  Stress: Not on file  Social Connections: Not on file  Intimate Partner Violence: Not on file    Review of Systems  Constitutional:  Positive for malaise/fatigue.  Negative for chills, fever and weight loss.  Respiratory:  Negative for shortness of breath.   Cardiovascular:  Negative for chest pain, palpitations and leg swelling.  Gastrointestinal:  Negative for abdominal pain, constipation, diarrhea, nausea and vomiting.  Genitourinary:  Negative for dysuria, frequency and urgency.  Skin:  Positive for itching and rash.  Neurological:  Positive for headaches. Negative for dizziness and focal weakness.  Psychiatric/Behavioral:  Negative for depression and suicidal ideas. The patient is nervous/anxious and has insomnia.         Objective    BP 116/72 (BP Location: Left Arm, Patient Position: Sitting, Cuff Size: Large)   Pulse 88   Temp 97.6 F (36.4 C) (Temporal)   Ht 5\' 4"  (1.626 m)   Wt 123 lb (55.8 kg)   LMP 08/07/2019 (Approximate)   SpO2 98%   BMI 21.11 kg/m   Physical Exam Constitutional:      General: She is not in acute distress.    Appearance: She is not ill-appearing.  HENT:     Right Ear: Tympanic membrane, ear canal and external ear normal.     Left Ear: Tympanic membrane and ear canal normal.     Ears:     Comments: Left ear with erythema, mild edema, no drainage.  Eyes:     Extraocular Movements: Extraocular movements intact.     Conjunctiva/sclera: Conjunctivae normal.  Cardiovascular:     Rate and Rhythm: Normal rate and regular rhythm.  Pulmonary:     Effort: Pulmonary effort is normal.     Breath sounds: Normal breath sounds.  Musculoskeletal:     Cervical back: Normal range of motion and neck supple.  Skin:    General: Skin is warm and dry.     Findings: Rash present.  Neurological:     General: No focal deficit present.     Mental Status: She is alert and oriented to person, place, and time.     Motor: No weakness.  Psychiatric:        Mood and Affect: Mood normal.        Behavior: Behavior normal.        Thought Content: Thought content normal.     {Labs (Optional):23779}    Assessment & Plan:    Problem List Items Addressed This Visit   None Visit Diagnoses     Fatigue, unspecified type    -  Primary   Relevant Orders   CBC with Differential/Platelet (Completed)   Comprehensive metabolic panel (Completed)   Vitamin B12 (Completed)   VITAMIN D 25 Hydroxy (Vit-D Deficiency, Fractures) (Completed)   TSH (Completed)   T4, free (Completed)   Encounter to establish care       Hypothyroidism, unspecified type       Relevant Orders   TSH (Completed)   T4, free (Completed)   Generalized anxiety disorder with panic attacks       Relevant Medications   FLUoxetine (PROZAC) 10 MG capsule   Pure hypercholesterolemia       Relevant Orders   Lipid panel (Completed)   Insomnia, unspecified type       Cellulitis of left external ear       Relevant Medications   mupirocin ointment (BACTROBAN) 2 %   Low TSH level       Vitamin D deficiency       Relevant Medications   Vitamin D, Ergocalciferol, (DRISDOL) 1.25 MG (50000 UNIT) CAPS capsule       Return in about 4 weeks (around 02/17/2023) for anxiety .   Hetty Blend, NP-C

## 2023-01-20 NOTE — Patient Instructions (Addendum)
Please go downstairs for labs before you leave.  Use the antibiotic ointment on your left ear.  Follow-up if it is getting worse.  Use over-the-counter hydrocortisone on bites on your arms.  Start taking fluoxetine daily.  Avoid any mind altering substances including Gummies, alcohol, etc.  I recommend scheduling with a therapist.  [email or web address]  [email or web address]  [facility]   Or anywhere you would like to schedule.    Follow-up here in 4 weeks.

## 2023-01-22 MED ORDER — VITAMIN D (ERGOCALCIFEROL) 1.25 MG (50000 UNIT) PO CAPS
50000.0000 [IU] | ORAL_CAPSULE | ORAL | 2 refills | Status: DC
Start: 2023-01-22 — End: 2023-07-08

## 2023-01-25 DIAGNOSIS — E039 Hypothyroidism, unspecified: Secondary | ICD-10-CM | POA: Insufficient documentation

## 2023-01-25 DIAGNOSIS — R7989 Other specified abnormal findings of blood chemistry: Secondary | ICD-10-CM | POA: Insufficient documentation

## 2023-01-25 DIAGNOSIS — R5383 Other fatigue: Secondary | ICD-10-CM | POA: Insufficient documentation

## 2023-01-25 DIAGNOSIS — H6012 Cellulitis of left external ear: Secondary | ICD-10-CM | POA: Insufficient documentation

## 2023-01-25 DIAGNOSIS — E7800 Pure hypercholesterolemia, unspecified: Secondary | ICD-10-CM | POA: Insufficient documentation

## 2023-01-25 DIAGNOSIS — E559 Vitamin D deficiency, unspecified: Secondary | ICD-10-CM | POA: Insufficient documentation

## 2023-01-25 NOTE — Assessment & Plan Note (Signed)
Per EMR.  She is not on thyroid medication.  Check thyroid panel.

## 2023-01-25 NOTE — Assessment & Plan Note (Signed)
Follow-up in 4 weeks.  Consider thyroid ultrasound since she is not taking medication

## 2023-01-25 NOTE — Assessment & Plan Note (Signed)
Restart fluoxetine since she has done well on this in the past.  Strongly encouraged her to call and schedule with a therapist.  Follow-up in 4 weeks or sooner if needed.

## 2023-01-25 NOTE — Assessment & Plan Note (Signed)
Check labs.  Recommend counseling for anxiety

## 2023-01-25 NOTE — Assessment & Plan Note (Signed)
Once weekly high-dose vitamin D supplement prescribed x 12 weeks.  Recommend she start multivitamin.

## 2023-01-25 NOTE — Assessment & Plan Note (Signed)
Bactroban prescribed.  Follow-up if worsening.

## 2023-01-25 NOTE — Assessment & Plan Note (Signed)
Recommend low-fat, low-cholesterol diet and getting adequate physical activity.

## 2023-01-25 NOTE — Assessment & Plan Note (Signed)
Check labs to look for underlying etiology. 

## 2023-02-11 ENCOUNTER — Ambulatory Visit: Payer: 59 | Admitting: Family Medicine

## 2023-02-17 ENCOUNTER — Ambulatory Visit: Payer: 59 | Admitting: Family Medicine

## 2023-06-02 ENCOUNTER — Ambulatory Visit: Payer: Self-pay | Admitting: Family Medicine

## 2023-06-10 ENCOUNTER — Ambulatory Visit: Payer: Self-pay | Admitting: Family Medicine

## 2023-07-08 ENCOUNTER — Encounter: Payer: Self-pay | Admitting: Family Medicine

## 2023-07-08 ENCOUNTER — Ambulatory Visit: Payer: 59 | Admitting: Family Medicine

## 2023-07-08 VITALS — BP 130/90 | HR 96 | Temp 98.1°F | Ht 63.6 in | Wt 146.0 lb

## 2023-07-08 DIAGNOSIS — Z1159 Encounter for screening for other viral diseases: Secondary | ICD-10-CM | POA: Diagnosis not present

## 2023-07-08 DIAGNOSIS — R7989 Other specified abnormal findings of blood chemistry: Secondary | ICD-10-CM

## 2023-07-08 DIAGNOSIS — Z2821 Immunization not carried out because of patient refusal: Secondary | ICD-10-CM

## 2023-07-08 DIAGNOSIS — Z79899 Other long term (current) drug therapy: Secondary | ICD-10-CM

## 2023-07-08 DIAGNOSIS — Z7689 Persons encountering health services in other specified circumstances: Secondary | ICD-10-CM

## 2023-07-08 DIAGNOSIS — F319 Bipolar disorder, unspecified: Secondary | ICD-10-CM | POA: Diagnosis not present

## 2023-07-08 DIAGNOSIS — R5383 Other fatigue: Secondary | ICD-10-CM

## 2023-07-08 NOTE — Progress Notes (Signed)
I,Jameka J Llittleton, CMA,acting as a Neurosurgeon for [employer], NP.,have documented all relevant documentation on the behalf of Ellender Hose, NP,as directed by  Ellender Hose, NP while in the presence of Ellender Hose, NP.  Subjective:  Patient ID: Sheri Flores , female    DOB: [birth year] , 47 y.o.   MRN: [id number]  Chief Complaint  Patient presents with   Establish Care    HPI  Patient is a 47 year old female presents today to establish primary care. Patient about her the results of her last thyroid function labs that were drawn especially because of the fatigue that she has been experiencing.Patient has a medical diagnosis of Bipolar Disorder and takes Abilify 10 mg at bedtime .Patient reports she goes to [facility] for psychiatry and her therapist is named [place].   She reports that she had a total hysterectomy in 11/2022, d/t Uterine Adenomyosis. Her  OB-GYN, Dr Estanislado Pandy.     Past Medical History:  Diagnosis Date   Adenomyosis of uterus    ADHD (attention deficit hyperactivity disorder)    Complication of anesthesia    "woke up in middle of tubal and colonscopy"   GAD (generalized anxiety disorder)    Hemorrhoids    History of gastritis 09/2018   History of Helicobacter pylori infection 09/2018   per EGD bx ;   completed treatment   MDD (major depressive disorder)    Mild asthma    Pelvic pain      Family History  Problem Relation Age of Onset   Breast cancer Paternal Grandmother 69   Breast cancer Maternal Grandmother    Colon cancer Paternal Grandfather    Rectal cancer Neg Hx    Stomach cancer Neg Hx    Esophageal cancer Neg Hx      Current Outpatient Medications:    albuterol (VENTOLIN HFA) 108 (90 Base) MCG/ACT inhaler, INHALE 2 PUFFS INTO THE LUNGS EVERY 6 HOURS AS NEEDED FOR WHEEZING OR SHORTNESS OF BREATH, Disp: 8.5 g, Rfl: 0   ARIPiprazole (ABILIFY) 10 MG tablet, Take 10 mg by mouth at bedtime., Disp: , Rfl:    cloNIDine (CATAPRES) 0.1 MG tablet, Take 0.1 mg by  mouth 2 (two) times daily., Disp: , Rfl:    Cyanocobalamin (CVS B12 GUMMIES) 500 MCG CHEW, Chew 2 tablets by mouth daily., Disp: , Rfl:    sertraline (ZOLOFT) 100 MG tablet, Take 200 mg by mouth daily., Disp: , Rfl:    Allergies  Allergen Reactions   Aspartame Swelling   Aspirin     Gi upset with larger doses   Hydrocodone Itching   Trazodone And Nefazodone Other (See Comments)    nightmares     Review of Systems  Constitutional: Negative.   Eyes: Negative.   Respiratory: Negative.    Genitourinary: Negative.   Musculoskeletal: Negative.   Skin: Negative.   Psychiatric/Behavioral:  Negative for behavioral problems. The patient is nervous/anxious.      Today's Vitals   07/08/23 0909  BP: (!) 130/90  Pulse: 96  Temp: 98.1 F (36.7 C)  Weight: 146 lb (66.2 kg)  Height: 5' 3.6" (1.615 m)  PainSc: 0-No pain   Body mass index is 25.38 kg/m.  Wt Readings from Last 3 Encounters:  07/08/23 146 lb (66.2 kg)  01/20/23 123 lb (55.8 kg)  11/16/22 120 lb 6.4 oz (54.6 kg)    The 10-year ASCVD risk score (Arnett DK, et al., 2019) is: 3.2%   Values used to calculate the score:  Age: 47 years     Sex: Female     Is Non-Hispanic African American: No     Diabetic: No     Tobacco smoker: Yes     Systolic Blood Pressure: 130 mmHg     Is BP treated: No     HDL Cholesterol: 51.1 mg/dL     Total Cholesterol: 188 mg/dL  Objective:  Physical Exam Constitutional:      Appearance: Normal appearance.  Cardiovascular:     Rate and Rhythm: Normal rate and regular rhythm.     Pulses: Normal pulses.     Heart sounds: Normal heart sounds.  Pulmonary:     Effort: Pulmonary effort is normal.     Breath sounds: Normal breath sounds.  Abdominal:     General: Bowel sounds are normal.  Musculoskeletal:        General: Normal range of motion.  Skin:    General: Skin is warm and dry.  Neurological:     General: No focal deficit present.     Mental Status: She is alert and oriented to  person, place, and time. Mental status is at baseline.  Psychiatric:        Mood and Affect: Mood is anxious.        Behavior: Behavior is uncooperative.         Assessment And Plan:  Establishing care with new doctor, encounter for  Low TSH level -     TSH + free T4  Bipolar disorder without psychotic features [facility]) Assessment & Plan: Chronic.Continue tx with Abilify 10 mg every day .   Encounter for hepatitis C screening test for low risk patient -     Hepatitis C antibody  High risk medication use -     CMP14+EGFR -     CBC with Differential/Platelet  COVID-19 vaccination declined  Influenza vaccination declined    Return in 5 months (on 12/06/2023) for  physical,thyroid.  Patient was given opportunity to ask questions. Patient verbalized understanding of the plan and was able to repeat key elements of the plan. All questions were answered to their satisfaction.    I, Ellender Hose, NP, have reviewed all documentation for this visit. The documentation on 07/16/2023 for the exam, diagnosis, procedures, and orders are all accurate and complete.    IF YOU HAVE BEEN REFERRED TO A SPECIALIST, IT MAY TAKE 1-2 WEEKS TO SCHEDULE/PROCESS THE REFERRAL. IF YOU HAVE NOT HEARD FROM US/SPECIALIST IN TWO WEEKS, PLEASE GIVE [place] A CALL AT [phone].

## 2023-07-09 ENCOUNTER — Encounter: Payer: Self-pay | Admitting: Family Medicine

## 2023-07-09 ENCOUNTER — Telehealth: Payer: Self-pay

## 2023-07-09 LAB — CMP14+EGFR
ALT: 45 [IU]/L — ABNORMAL HIGH (ref 0–32)
AST: 33 [IU]/L (ref 0–40)
Albumin: 4.4 g/dL (ref 3.9–4.9)
Alkaline Phosphatase: 117 [IU]/L (ref 44–121)
BUN/Creatinine Ratio: 18 (ref 9–23)
BUN: 13 mg/dL (ref 6–24)
Bilirubin Total: <0.2 mg/dL (ref 0.0–1.2)
CO2: 25 mmol/L (ref 20–29)
Calcium: 9.8 mg/dL (ref 8.7–10.2)
Chloride: 102 mmol/L (ref 96–106)
Creatinine, Ser: 0.71 mg/dL (ref 0.57–1.00)
Globulin, Total: 2.4 g/dL (ref 1.5–4.5)
Glucose: 93 mg/dL (ref 70–99)
Potassium: 4.1 mmol/L (ref 3.5–5.2)
Sodium: 143 mmol/L (ref 134–144)
Total Protein: 6.8 g/dL (ref 6.0–8.5)
eGFR: 105 mL/min/{1.73_m2} (ref >59)

## 2023-07-09 LAB — CBC WITH DIFFERENTIAL/PLATELET
Basophils Absolute: 0.1 10*3/uL (ref 0.0–0.2)
Basos: 1 %
EOS (ABSOLUTE): 0.3 10*3/uL (ref 0.0–0.4)
Eos: 4 %
Hematocrit: 42.1 % (ref 34.0–46.6)
Hemoglobin: 13.7 g/dL (ref 11.1–15.9)
Immature Grans (Abs): 0 10*3/uL (ref 0.0–0.1)
Immature Granulocytes: 0 %
Lymphocytes Absolute: 2.1 10*3/uL (ref 0.7–3.1)
Lymphs: 28 %
MCH: 30.5 pg (ref 26.6–33.0)
MCHC: 32.5 g/dL (ref 31.5–35.7)
MCV: 94 fL (ref 79–97)
Monocytes Absolute: 0.8 10*3/uL (ref 0.1–0.9)
Monocytes: 11 %
Neutrophils Absolute: 4.2 10*3/uL (ref 1.4–7.0)
Neutrophils: 56 %
Platelets: 312 10*3/uL (ref 150–450)
RBC: 4.49 x10E6/uL (ref 3.77–5.28)
RDW: 12.1 % (ref 11.7–15.4)
WBC: 7.5 10*3/uL (ref 3.4–10.8)

## 2023-07-09 LAB — TSH+FREE T4
Free T4: 1.47 ng/dL (ref 0.82–1.77)
TSH: 1.06 u[IU]/mL (ref 0.450–4.500)

## 2023-07-09 LAB — HEPATITIS C ANTIBODY: Hep C Virus Ab: NONREACTIVE

## 2023-07-09 NOTE — Telephone Encounter (Signed)
error 

## 2023-07-17 DIAGNOSIS — Z7689 Persons encountering health services in other specified circumstances: Secondary | ICD-10-CM | POA: Insufficient documentation

## 2023-07-17 DIAGNOSIS — Z79899 Other long term (current) drug therapy: Secondary | ICD-10-CM | POA: Insufficient documentation

## 2023-07-17 DIAGNOSIS — Z1159 Encounter for screening for other viral diseases: Secondary | ICD-10-CM | POA: Insufficient documentation

## 2023-07-17 DIAGNOSIS — F319 Bipolar disorder, unspecified: Secondary | ICD-10-CM | POA: Insufficient documentation

## 2023-07-17 NOTE — Assessment & Plan Note (Signed)
Chronic.Continue tx with Abilify 10 mg every day .

## 2023-07-17 NOTE — Progress Notes (Signed)
ALT(liver function test) elevated; minimize use of acetaminophen and alcohol, will recheck during next visit. Thyroid levels and other labs are normal.

## 2023-07-28 ENCOUNTER — Encounter: Payer: Self-pay | Admitting: Family Medicine

## 2023-08-05 ENCOUNTER — Ambulatory Visit: Payer: Self-pay | Admitting: Family Medicine

## 2023-09-21 ENCOUNTER — Other Ambulatory Visit: Payer: Self-pay

## 2023-09-21 ENCOUNTER — Telehealth: Payer: Self-pay

## 2023-09-21 MED ORDER — ALBUTEROL SULFATE HFA 108 (90 BASE) MCG/ACT IN AERS: 2.0000 | INHALATION_SPRAY | Freq: Four times a day (QID) | RESPIRATORY_TRACT | 0 refills | Status: AC | PRN

## 2023-09-21 NOTE — Telephone Encounter (Signed)
Has called multi times regarding a call back about her Rx she said she has some questions.

## 2023-09-29 ENCOUNTER — Ambulatory Visit: Payer: Commercial Managed Care - HMO | Admitting: Family Medicine

## 2023-10-03 ENCOUNTER — Encounter (INDEPENDENT_AMBULATORY_CARE_PROVIDER_SITE_OTHER): Payer: Self-pay

## 2023-10-20 ENCOUNTER — Ambulatory Visit: Payer: Commercial Managed Care - HMO | Admitting: Family Medicine

## 2023-11-03 ENCOUNTER — Ambulatory Visit: Admitting: Family Medicine

## 2023-11-03 ENCOUNTER — Encounter: Payer: Self-pay | Admitting: Family Medicine

## 2023-11-03 VITALS — BP 120/64 | HR 87 | Temp 97.7°F | Ht 63.6 in | Wt 148.0 lb

## 2023-11-03 DIAGNOSIS — F319 Bipolar disorder, unspecified: Secondary | ICD-10-CM

## 2023-11-03 DIAGNOSIS — R300 Dysuria: Secondary | ICD-10-CM | POA: Diagnosis not present

## 2023-11-03 LAB — POCT URINALYSIS DIPSTICK
Bilirubin, UA: NEGATIVE
Glucose, UA: NEGATIVE
Ketones, UA: NEGATIVE
Leukocytes, UA: NEGATIVE
Nitrite, UA: POSITIVE
Protein, UA: POSITIVE — AB
Spec Grav, UA: >=1.03 — AB (ref 1.010–1.025)
Urobilinogen, UA: 0.2 U/dL
pH, UA: 5.5 (ref 5.0–8.0)

## 2023-11-03 MED ORDER — NITROFURANTOIN MONOHYD MACRO 100 MG PO CAPS
100.0000 mg | ORAL_CAPSULE | Freq: Two times a day (BID) | ORAL | 0 refills | Status: AC
Start: 2023-11-03 — End: 2023-11-08

## 2023-11-03 NOTE — Progress Notes (Signed)
 I,Jameka J Llittleton, CMA,acting as a Neurosurgeon for [employer], NP.,have documented all relevant documentation on the behalf of Ellender Hose, NP,as directed by  Ellender Hose, NP while in the presence of Ellender Hose, NP.  Subjective:  Patient ID: Sheri Flores , female    DOB: 1975-08-25 , 48 y.o.   MRN: [id number]  Chief Complaint  Patient presents with   Dysuria    HPI  Patient is a 48 year old female presents today for a dysuria, foul smelling urine, flank pain that started over a week ago. She states that she has been drinking lots of water and cranberry juice but it did not relieve all her symptoms. She denies any chills, fever or hematuria. Patient also denies any episodes of incontinence. She is followed by [facility]  [facility]iatry for the treatment of Bipolar disorder.  Dysuria  This is a recurrent problem. The current episode started 1 to 4 weeks ago. The problem occurs every urination. The problem has been waxing and waning. The quality of the pain is described as burning. The pain is at a severity of 5/10. The pain is moderate. There has been no fever. She is Not sexually active. There is No history of pyelonephritis. Associated symptoms include flank pain. Pertinent negatives include no frequency, hematuria, sweats, urgency or vomiting. She has tried increased fluids for the symptoms.     Past Medical History:  Diagnosis Date   Adenomyosis of uterus    ADHD (attention deficit hyperactivity disorder)    Complication of anesthesia    "woke up in middle of tubal and colonscopy"   GAD (generalized anxiety disorder)    Hemorrhoids    History of gastritis 09/2018   History of Helicobacter pylori infection 09/2018   per EGD bx ;   completed treatment   MDD (major depressive disorder)    Mild asthma    Pelvic pain      Family History  Problem Relation Age of Onset   Breast cancer Paternal Grandmother 96   Breast cancer Maternal Grandmother    Colon cancer Paternal Grandfather     Rectal cancer Neg Hx    Stomach cancer Neg Hx    Esophageal cancer Neg Hx      Current Outpatient Medications:    albuterol (VENTOLIN HFA) 108 (90 Base) MCG/ACT inhaler, Inhale 2 puffs into the lungs every 6 (six) hours as needed for wheezing or shortness of breath., Disp: 8.5 g, Rfl: 0   ARIPiprazole (ABILIFY) 2 MG tablet, Take 2 mg by mouth daily., Disp: , Rfl:    cloNIDine (CATAPRES) 0.1 MG tablet, Take 0.1 mg by mouth 2 (two) times daily., Disp: , Rfl:    Cyanocobalamin (CVS B12 GUMMIES) 500 MCG CHEW, Chew 2 tablets by mouth daily., Disp: , Rfl:    lamoTRIgine (LAMICTAL) 150 MG tablet, Take 150 mg by mouth daily., Disp: , Rfl:    sertraline (ZOLOFT) 100 MG tablet, Take 200 mg by mouth daily., Disp: , Rfl:    Allergies  Allergen Reactions   Aspartame Swelling   Aspirin     Gi upset with larger doses   Hydrocodone Itching   Trazodone And Nefazodone Other (See Comments)    nightmares     Review of Systems  Constitutional: Negative.   HENT: Negative.    Cardiovascular: Negative.   Gastrointestinal:  Positive for abdominal pain. Negative for constipation and vomiting.  Genitourinary:  Positive for dysuria and flank pain. Negative for frequency, hematuria and urgency.  Neurological: Negative.  Hematological: Negative.   Psychiatric/Behavioral:  The patient is hyperactive.      Today's Vitals   11/03/23 0953  BP: 120/64  Pulse: 87  Temp: 97.7 F (36.5 C)  TempSrc: Oral  Weight: 148 lb (67.1 kg)  Height: 5' 3.6" (1.615 m)  PainSc: 0-No pain   Body mass index is 25.72 kg/m.  Wt Readings from Last 3 Encounters:  11/03/23 148 lb (67.1 kg)  07/08/23 146 lb (66.2 kg)  01/20/23 123 lb (55.8 kg)    The 10-year ASCVD risk score (Arnett DK, et al., 2019) is: 2.7%   Values used to calculate the score:     Age: 24 years     Sex: Female     Is Non-Hispanic African American: No     Diabetic: No     Tobacco smoker: Yes     Systolic Blood Pressure: 120 mmHg     Is BP  treated: No     HDL Cholesterol: 51.1 mg/dL     Total Cholesterol: 188 mg/dL  Objective:  Physical Exam HENT:     Head: Normocephalic.  Cardiovascular:     Rate and Rhythm: Normal rate and regular rhythm.  Pulmonary:     Effort: Pulmonary effort is normal.     Breath sounds: Normal breath sounds.  Abdominal:     Tenderness: There is abdominal tenderness. There is right CVA tenderness.  Neurological:     Mental Status: She is alert.         Assessment And Plan:  Dysuria -     POCT urinalysis dipstick -     Nitrofurantoin Monohyd Macro; Take 1 capsule (100 mg total) by mouth 2 (two) times daily for 5 days.  Dispense: 10 capsule; Refill: 0  Bipolar disorder without psychotic features (HCC)    Return if symptoms worsen or fail to improve, for Keep next appt.  Patient was given opportunity to ask questions. Patient verbalized understanding of the plan and was able to repeat key elements of the plan. All questions were answered to their satisfaction.    I, Ellender Hose, NP, have reviewed all documentation for this visit. The documentation on 11/12/2023 for the exam, diagnosis, procedures, and orders are all accurate and complete.   IF YOU HAVE BEEN REFERRED TO A SPECIALIST, IT MAY TAKE 1-2 WEEKS TO SCHEDULE/PROCESS THE REFERRAL. IF YOU HAVE NOT HEARD FROM US/SPECIALIST IN TWO WEEKS, PLEASE GIVE [place] A CALL AT [phone].

## 2023-11-16 ENCOUNTER — Telehealth: Admitting: Family Medicine

## 2023-11-16 DIAGNOSIS — R220 Localized swelling, mass and lump, head: Secondary | ICD-10-CM | POA: Diagnosis not present

## 2023-11-16 DIAGNOSIS — B001 Herpesviral vesicular dermatitis: Secondary | ICD-10-CM

## 2023-11-16 DIAGNOSIS — T7840XA Allergy, unspecified, initial encounter: Secondary | ICD-10-CM

## 2023-11-16 MED ORDER — CETIRIZINE HCL 10 MG PO TABS
10.0000 mg | ORAL_TABLET | Freq: Every day | ORAL | 11 refills | Status: AC
Start: 2023-11-16 — End: ?

## 2023-11-16 MED ORDER — MUPIROCIN 2 % EX OINT: 1.0000 | TOPICAL_OINTMENT | Freq: Two times a day (BID) | CUTANEOUS | 0 refills | Status: AC

## 2023-11-16 MED ORDER — PREDNISONE 10 MG (21) PO TBPK
ORAL_TABLET | ORAL | 0 refills | Status: DC
Start: 2023-11-16 — End: 2023-12-08

## 2023-11-16 MED ORDER — FAMOTIDINE 20 MG PO TABS
20.0000 mg | ORAL_TABLET | Freq: Every day | ORAL | 0 refills | Status: AC
Start: 2023-11-16 — End: ?

## 2023-11-16 MED ORDER — VALACYCLOVIR HCL 1 G PO TABS
2000.0000 mg | ORAL_TABLET | Freq: Two times a day (BID) | ORAL | 0 refills | Status: AC
Start: 2023-11-16 — End: 2023-11-17

## 2023-11-16 NOTE — Patient Instructions (Addendum)
 Sheri Flores, thank you for joining [name], NP for today's virtual visit.  While this provider is not your primary care provider (PCP), if your PCP is located in our provider database this encounter information will be shared with them immediately following your visit.   A [facility] MyChart account gives you access to today's visit and all your visits, tests, and labs performed at [facility] " click here if you don't have a [facility] MyChart account or go to [email or web address]  Consent: (Patient) Sheri Flores provided verbal consent for this virtual visit at the beginning of the encounter.  Current Medications:  Current Outpatient Medications:    cetirizine (ZYRTEC) 10 MG tablet, Take 1 tablet (10 mg total) by mouth daily., Disp: 30 tablet, Rfl: 11   famotidine (PEPCID) 20 MG tablet, Take 1 tablet (20 mg total) by mouth daily., Disp: 30 tablet, Rfl: 0   mupirocin ointment (BACTROBAN) 2 %, Apply 1 Application topically 2 (two) times daily., Disp: 22 g, Rfl: 0   predniSONE (STERAPRED UNI-PAK 21 TAB) 10 MG (21) TBPK tablet, Take as directed, Disp: 21 tablet, Rfl: 0   valACYclovir (VALTREX) 1000 MG tablet, Take 2 tablets (2,000 mg total) by mouth 2 (two) times daily for 1 day., Disp: 4 tablet, Rfl: 0   albuterol (VENTOLIN HFA) 108 (90 Base) MCG/ACT inhaler, Inhale 2 puffs into the lungs every 6 (six) hours as needed for wheezing or shortness of breath., Disp: 8.5 g, Rfl: 0   ARIPiprazole (ABILIFY) 2 MG tablet, Take 2 mg by mouth daily., Disp: , Rfl:    cloNIDine (CATAPRES) 0.1 MG tablet, Take 0.1 mg by mouth 2 (two) times daily., Disp: , Rfl:    Cyanocobalamin ([employer] B12 GUMMIES) 500 MCG CHEW, Chew 2 tablets by mouth daily., Disp: , Rfl:    lamoTRIgine (LAMICTAL) 150 MG tablet, Take 150 mg by mouth daily., Disp: , Rfl:    sertraline (ZOLOFT) 100 MG tablet, Take 200 mg by mouth daily., Disp: , Rfl:    Medications ordered in this encounter:  Meds ordered this  encounter  Medications   predniSONE (STERAPRED UNI-PAK 21 TAB) 10 MG (21) TBPK tablet    Sig: Take as directed    Dispense:  21 tablet    Refill:  0    Supervising Provider:   Merrilee Jansky [[id number]]   valACYclovir (VALTREX) 1000 MG tablet    Sig: Take 2 tablets (2,000 mg total) by mouth 2 (two) times daily for 1 day.    Dispense:  4 tablet    Refill:  0    Supervising Provider:   Merrilee Jansky [[id number]]   mupirocin ointment (BACTROBAN) 2 %    Sig: Apply 1 Application topically 2 (two) times daily.    Dispense:  22 g    Refill:  0    Supervising Provider:   Merrilee Jansky [[id number]]   cetirizine (ZYRTEC) 10 MG tablet    Sig: Take 1 tablet (10 mg total) by mouth daily.    Dispense:  30 tablet    Refill:  11    Supervising Provider:   Merrilee Jansky [[id number]]   famotidine (PEPCID) 20 MG tablet    Sig: Take 1 tablet (20 mg total) by mouth daily.    Dispense:  30 tablet    Refill:  0    Supervising Provider:   Merrilee Jansky [phone]     *If you need refills on other medications prior to your next  appointment, please contact your pharmacy*  Follow-Up: Call back or seek an in-person evaluation if the symptoms worsen or if the condition fails to improve as anticipated.  [facility] [phone]  Other Instructions  -use medications as we discussed -strict in person if you have any changes in swelling, swallowing, talking or breathing   If you have been instructed to have an in-person evaluation today at a local Urgent Care facility, please use the link below. It will take you to a list of all of our available [facility], including address, phone number and hours of operation. Please do not delay care.  [facility]  If you or a family member do not have a primary care provider, use the link below to schedule a visit and establish care. When you choose a [facility] primary care physician or advanced practice provider,  you gain a long-term partner in health. Find a Primary Care Provider  Learn more about [facility]'s in-office and virtual care options: [facility] - Get Care Now

## 2023-11-16 NOTE — Progress Notes (Signed)
 Virtual Visit Consent   Sheri Flores, you are scheduled for a virtual visit with a [facility] provider today. Just as with appointments in the office, your consent must be obtained to participate. Your consent will be active for this visit and any virtual visit you may have with one of our providers in the next 365 days. If you have a MyChart account, a copy of this consent can be sent to you electronically.  As this is a virtual visit, video technology does not allow for your provider to perform a traditional examination. This may limit your provider's ability to fully assess your condition. If your provider identifies any concerns that need to be evaluated in person or the need to arrange testing (such as labs, EKG, etc.), we will make arrangements to do so. Although advances in technology are sophisticated, we cannot ensure that it will always work on either your end or our end. If the connection with a video visit is poor, the visit may have to be switched to a telephone visit. With either a video or telephone visit, we are not always able to ensure that we have a secure connection.  By engaging in this virtual visit, you consent to the provision of healthcare and authorize for your insurance to be billed (if applicable) for the services provided during this visit. Depending on your insurance coverage, you may receive a charge related to this service.  I need to obtain your verbal consent now. Are you willing to proceed with your visit today? Sheri Flores has provided verbal consent on 11/16/2023 for a virtual visit (video or telephone). [name], NP  Date: 11/16/2023 9:47 AM   Virtual Visit via Video Note   I, [name], connected with  Sheri Flores  ([id number], September 09, 1975) on 11/16/23 at 11:15 AM EDT by a video-enabled telemedicine application and verified that I am speaking with the correct person using two identifiers.  Location: Patient: Virtual Visit Location Patient:  Home Provider: Virtual Visit Location Provider: Home Office   I discussed the limitations of evaluation and management by telemedicine and the availability of in person appointments. The patient expressed understanding and agreed to proceed.    History of Present Illness: Sheri Flores is a 47 y.o. who identifies as a female who was assigned female at birth, and is being seen today for lip swelling  Onset was last night- reported felt like cold sore coming on- applied ice, but then woke with is swollen. Applied neosporin and it improved and then worsened again. Only the bottom right side of lip- two areas of bumps like cold sores along lip line.  Associated symptoms are none  Denies chest pain, shortness of breath, fevers, chills, no tongue swelling, airway changes, voice changes, speech trouble, salvia drooling.  No new foods, meds, or supplements or makeup    Problems:  Patient Active Problem List   Diagnosis Date Noted   High risk medication use 07/17/2023   Bipolar disorder without psychotic features (HCC) 07/17/2023   Establishing care with new doctor, encounter for 07/17/2023   Encounter for hepatitis C screening test for low risk patient 07/17/2023   Fatigue 01/25/2023   Hypothyroidism 01/25/2023   Vitamin D deficiency 01/25/2023   Low TSH level 01/25/2023   Cellulitis of left external ear 01/25/2023   Pure hypercholesterolemia 01/25/2023   S/P robot-assisted surgical procedure 11/16/2022   Poor concentration 08/29/2019   Rash 05/19/2019   Lumbar back pain with radiculopathy affecting  lower extremity 01/05/2019   Elevated BP without diagnosis of hypertension 11/30/2018   Herpes labialis 11/21/2018   Generalized anxiety disorder with panic attacks 08/29/2018   Insomnia 08/29/2018   Hematochezia 08/29/2018   Bronchitis 08/02/2018   Acute bilateral low back pain without sciatica 06/06/2018   Healthcare maintenance 05/09/2018   Loose stools 05/09/2018   Abnormal urinalysis  05/09/2018    Allergies:  Allergies  Allergen Reactions   Aspartame Swelling   Aspirin     Gi upset with larger doses   Hydrocodone Itching   Trazodone And Nefazodone Other (See Comments)    nightmares   Medications:  Current Outpatient Medications:    albuterol (VENTOLIN HFA) 108 (90 Base) MCG/ACT inhaler, Inhale 2 puffs into the lungs every 6 (six) hours as needed for wheezing or shortness of breath., Disp: 8.5 g, Rfl: 0   ARIPiprazole (ABILIFY) 2 MG tablet, Take 2 mg by mouth daily., Disp: , Rfl:    cloNIDine (CATAPRES) 0.1 MG tablet, Take 0.1 mg by mouth 2 (two) times daily., Disp: , Rfl:    Cyanocobalamin (CVS B12 GUMMIES) 500 MCG CHEW, Chew 2 tablets by mouth daily., Disp: , Rfl:    lamoTRIgine (LAMICTAL) 150 MG tablet, Take 150 mg by mouth daily., Disp: , Rfl:    sertraline (ZOLOFT) 100 MG tablet, Take 200 mg by mouth daily., Disp: , Rfl:   Observations/Objective: Patient is well-developed, well-nourished in no acute distress.  Resting comfortably  at home.  Head is normocephalic, atraumatic.  No labored breathing.  Speech is clear and coherent with logical content.  Patient is alert and oriented at baseline.  Bottom lower right side of lip is swollen No speech impairment, speaking clearly.  Assessment and Plan: 1. Lip swelling  - predniSONE (STERAPRED UNI-PAK 21 TAB) 10 MG (21) TBPK tablet; Take as directed  Dispense: 21 tablet; Refill: 0 - valACYclovir (VALTREX) 1000 MG tablet; Take 2 tablets (2,000 mg total) by mouth 2 (two) times daily for 1 day.  Dispense: 4 tablet; Refill: 0  2. Allergic disorder, initial encounter (Primary)  - predniSONE (STERAPRED UNI-PAK 21 TAB) 10 MG (21) TBPK tablet; Take as directed  Dispense: 21 tablet; Refill: 0 - cetirizine (ZYRTEC) 10 MG tablet; Take 1 tablet (10 mg total) by mouth daily.  Dispense: 30 tablet; Refill: 11 - famotidine (PEPCID) 20 MG tablet; Take 1 tablet (20 mg total) by mouth daily.  Dispense: 30 tablet; Refill: 0  3.  Cold sore  - valACYclovir (VALTREX) 1000 MG tablet; Take 2 tablets (2,000 mg total) by mouth 2 (two) times daily for 1 day.  Dispense: 4 tablet; Refill: 0  -lengthy discussion on concern for allergic reaction/cold sore infection  -reviewed medication at length and strict in person precautions as well.   -tolerating salvia, talking well and clear- no tongue swelling, airway is open and there is no coughing or red flag for ED needs at this time  Reviewed side effects, risks and benefits of medication.    Patient acknowledged agreement and understanding of the plan.   Past Medical, Surgical, Social History, Allergies, and Medications have been Reviewed.   Follow Up Instructions: I discussed the assessment and treatment plan with the patient. The patient was provided an opportunity to ask questions and all were answered. The patient agreed with the plan and demonstrated an understanding of the instructions.  A copy of instructions were sent to the patient via MyChart unless otherwise noted below.    The patient was advised to call back  or seek an in-person evaluation if the symptoms worsen or if the condition fails to improve as anticipated.    [name], NP

## 2023-12-08 ENCOUNTER — Ambulatory Visit (INDEPENDENT_AMBULATORY_CARE_PROVIDER_SITE_OTHER): Payer: 59 | Admitting: Family Medicine

## 2023-12-08 ENCOUNTER — Encounter: Payer: Self-pay | Admitting: Family Medicine

## 2023-12-08 VITALS — BP 124/80 | HR 96 | Temp 98.1°F | Ht 63.6 in | Wt 149.0 lb

## 2023-12-08 DIAGNOSIS — L0292 Furuncle, unspecified: Secondary | ICD-10-CM

## 2023-12-08 MED ORDER — DOXYCYCLINE HYCLATE 100 MG PO TABS
100.0000 mg | ORAL_TABLET | Freq: Two times a day (BID) | ORAL | 0 refills | Status: AC
Start: 2023-12-08 — End: 2023-12-15

## 2023-12-08 NOTE — Progress Notes (Deleted)
 I,Jameka J Llittleton, CMA,acting as a Neurosurgeon for [employer], NP.,have documented all relevant documentation on the behalf of Melodie Spry, NP,as directed by  Melodie Spry, NP while in the presence of Melodie Spry, NP.  Subjective:    Patient ID: Sheri Flores , female    DOB: 05/31/1976 , 48 y.o.   MRN: [id number]  No chief complaint on file.   HPI  Patient presents today for a physical. Patient is followed by Dr.Bovard for her GYN care. Patient doesn't have any questions or concerns at this time.     Past Medical History:  Diagnosis Date  . Adenomyosis of uterus   . ADHD (attention deficit hyperactivity disorder)   . Complication of anesthesia    "woke up in middle of tubal and colonscopy"  . GAD (generalized anxiety disorder)   . Hemorrhoids   . History of gastritis 09/2018  . History of Helicobacter pylori infection 09/2018   per EGD bx ;   completed treatment  . MDD (major depressive disorder)   . Mild asthma   . Pelvic pain      Family History  Problem Relation Age of Onset  . Breast cancer Paternal Grandmother 38  . Breast cancer Maternal Grandmother   . Colon cancer Paternal Grandfather   . Rectal cancer Neg Hx   . Stomach cancer Neg Hx   . Esophageal cancer Neg Hx      Current Outpatient Medications:  .  albuterol  (VENTOLIN  HFA) 108 (90 Base) MCG/ACT inhaler, Inhale 2 puffs into the lungs every 6 (six) hours as needed for wheezing or shortness of breath., Disp: 8.5 g, Rfl: 0 .  ARIPiprazole (ABILIFY) 2 MG tablet, Take 2 mg by mouth daily., Disp: , Rfl:  .  cetirizine  (ZYRTEC ) 10 MG tablet, Take 1 tablet (10 mg total) by mouth daily., Disp: 30 tablet, Rfl: 11 .  cloNIDine (CATAPRES) 0.1 MG tablet, Take 0.1 mg by mouth 2 (two) times daily., Disp: , Rfl:  .  Cyanocobalamin  (CVS B12 GUMMIES) 500 MCG CHEW, Chew 2 tablets by mouth daily., Disp: , Rfl:  .  famotidine  (PEPCID ) 20 MG tablet, Take 1 tablet (20 mg total) by mouth daily., Disp: 30 tablet, Rfl: 0 .   lamoTRIgine (LAMICTAL) 150 MG tablet, Take 150 mg by mouth daily., Disp: , Rfl:  .  mupirocin  ointment (BACTROBAN ) 2 %, Apply 1 Application topically 2 (two) times daily., Disp: 22 g, Rfl: 0 .  predniSONE  (STERAPRED UNI-PAK 21 TAB) 10 MG (21) TBPK tablet, Take as directed, Disp: 21 tablet, Rfl: 0 .  sertraline (ZOLOFT) 100 MG tablet, Take 200 mg by mouth daily., Disp: , Rfl:    Allergies  Allergen Reactions  . Aspartame Swelling  . Aspirin     Gi upset with larger doses  . Hydrocodone Itching  . Trazodone  And Nefazodone Other (See Comments)    nightmares      The patient states she uses {contraceptive methods:5051} for birth control. Patient's last menstrual period was 08/07/2019 (approximate).. {Dysmenorrhea-menorrhagia:21918}. Negative for: breast discharge, breast lump(s), breast pain and breast self exam. Associated symptoms include abnormal vaginal bleeding. Pertinent negatives include abnormal bleeding (hematology), anxiety, decreased libido, depression, difficulty falling sleep, dyspareunia, history of infertility, nocturia, sexual dysfunction, sleep disturbances, urinary incontinence, urinary urgency, vaginal discharge and vaginal itching. Diet regular.The patient states her exercise level is    . The patient's tobacco use is:  Social History   Tobacco Use  Smoking Status Every Day  . Types: Cigarettes  Smokeless  Tobacco Never  Tobacco Comments   11-11-2022   per pt trying to quit,  wast smoking 1/2 ppd,  down to yesterday smoked 3 cig  . She has been exposed to passive smoke. The patient's alcohol use is:  Social History   Substance and Sexual Activity  Alcohol Use Not Currently   Comment: socially  . Additional information: Last pap ***, next one scheduled for ***.    Review of Systems  Constitutional: Negative.   HENT: Negative.    Eyes: Negative.   Respiratory: Negative.    Cardiovascular: Negative.   Gastrointestinal: Negative.   Endocrine: Negative.    Genitourinary: Negative.   Musculoskeletal: Negative.   Skin: Negative.   Allergic/Immunologic: Negative.   Neurological: Negative.   Hematological: Negative.   Psychiatric/Behavioral: Negative.      There were no vitals filed for this visit. There is no height or weight on file to calculate BMI.  Wt Readings from Last 3 Encounters:  11/03/23 148 lb (67.1 kg)  07/08/23 146 lb (66.2 kg)  01/20/23 123 lb (55.8 kg)     Objective:  Physical Exam      Assessment And Plan:     Encounter for general adult medical examination w/o abnormal findings  Hypothyroidism, unspecified type  Bipolar disorder without psychotic features (HCC)  Pure hypercholesterolemia  Vitamin D  deficiency     No follow-ups on file. Patient was given opportunity to ask questions. Patient verbalized understanding of the plan and was able to repeat key elements of the plan. All questions were answered to their satisfaction.   Melodie Spry, NP  I, Melodie Spry, NP, have reviewed all documentation for this visit. The documentation on 12/08/23 for the exam, diagnosis, procedures, and orders are all accurate and complete.

## 2023-12-08 NOTE — Progress Notes (Signed)
 I,Jameka J Llittleton, CMA,acting as a Neurosurgeon for [employer], NP.,have documented all relevant documentation on the behalf of Melodie Spry, NP,as directed by  Melodie Spry, NP while in the presence of Melodie Spry, NP.  Subjective:  Patient ID: Sheri Flores , female    DOB: [birth year] , 48 y.o.   MRN: [id number]  Chief Complaint  Patient presents with   Recurrent Skin Infections    HPI  Patient is a 48 year old female who presents today for complaints of recurrent boils that started about 1 month ago. She states it came on  her lip initially , left ear and on the tip of her nose.She states that they start as a hard, red bump. Then it eventually has gets some brownish fluid draining out of it. She denies any fever or body aches. Will treat with Doxycycline  100 mg twice daily for 7 days.  Patient wants her annual physical examination deferred to next time.       Past Medical History:  Diagnosis Date   Adenomyosis of uterus    ADHD (attention deficit hyperactivity disorder)    Complication of anesthesia    "woke up in middle of tubal and colonscopy"   GAD (generalized anxiety disorder)    Hemorrhoids    History of gastritis 09/2018   History of Helicobacter pylori infection 09/2018   per EGD bx ;   completed treatment   MDD (major depressive disorder)    Mild asthma    Pelvic pain      Family History  Problem Relation Age of Onset   Breast cancer Paternal Grandmother 47   Breast cancer Maternal Grandmother    Colon cancer Paternal Grandfather    Rectal cancer Neg Hx    Stomach cancer Neg Hx    Esophageal cancer Neg Hx      Current Outpatient Medications:    albuterol  (VENTOLIN  HFA) 108 (90 Base) MCG/ACT inhaler, Inhale 2 puffs into the lungs every 6 (six) hours as needed for wheezing or shortness of breath., Disp: 8.5 g, Rfl: 0   ARIPiprazole (ABILIFY) 2 MG tablet, Take 2 mg by mouth daily., Disp: , Rfl:    cetirizine  (ZYRTEC ) 10 MG tablet, Take 1 tablet (10 mg total) by  mouth daily., Disp: 30 tablet, Rfl: 11   cloNIDine (CATAPRES) 0.1 MG tablet, Take 0.1 mg by mouth 2 (two) times daily., Disp: , Rfl:    Cyanocobalamin  (CVS B12 GUMMIES) 500 MCG CHEW, Chew 2 tablets by mouth daily., Disp: , Rfl:    doxycycline  (VIBRA -TABS) 100 MG tablet, Take 1 tablet (100 mg total) by mouth 2 (two) times daily for 7 days., Disp: 14 tablet, Rfl: 0   famotidine  (PEPCID ) 20 MG tablet, Take 1 tablet (20 mg total) by mouth daily., Disp: 30 tablet, Rfl: 0   lamoTRIgine (LAMICTAL) 150 MG tablet, Take 150 mg by mouth daily., Disp: , Rfl:    mupirocin  ointment (BACTROBAN ) 2 %, Apply 1 Application topically 2 (two) times daily., Disp: 22 g, Rfl: 0   sertraline (ZOLOFT) 100 MG tablet, Take 200 mg by mouth daily., Disp: , Rfl:    Allergies  Allergen Reactions   Aspartame Swelling   Aspirin     Gi upset with larger doses   Hydrocodone Itching   Trazodone  And Nefazodone Other (See Comments)    nightmares     Review of Systems  Constitutional: Negative.   HENT: Negative.    Respiratory: Negative.    Cardiovascular: Negative.   Skin:  Positive for color change.  Psychiatric/Behavioral:  Positive for behavioral problems.      Today's Vitals   12/08/23 0833  BP: 124/80  Pulse: 96  Temp: 98.1 F (36.7 C)  TempSrc: Oral  Weight: 149 lb (67.6 kg)  Height: 5' 3.6" (1.615 m)  PainSc: 0-No pain   Body mass index is 25.9 kg/m.  Wt Readings from Last 3 Encounters:  12/08/23 149 lb (67.6 kg)  11/03/23 148 lb (67.1 kg)  07/08/23 146 lb (66.2 kg)    The 10-year ASCVD risk score (Arnett DK, et al., 2019) is: 2.9%   Values used to calculate the score:     Age: 59 years     Sex: Female     Is Non-Hispanic African American: No     Diabetic: No     Tobacco smoker: Yes     Systolic Blood Pressure: 124 mmHg     Is BP treated: No     HDL Cholesterol: 51.1 mg/dL     Total Cholesterol: 188 mg/dL  Objective:  Physical Exam HENT:     Head: Normocephalic.  Cardiovascular:      Rate and Rhythm: Normal rate.  Pulmonary:     Effort: Pulmonary effort is normal.  Skin:    General: Skin is dry.     Comments: Tender, red swollen bumps on lips, nose and left ear  Neurological:     Mental Status: She is alert.         Assessment And Plan:  Boil -     Doxycycline  Hyclate; Take 1 tablet (100 mg total) by mouth 2 (two) times daily for 7 days.  Dispense: 14 tablet; Refill: 0    Return in about 6 weeks (around 01/19/2024) for reschedule physical appointment.  Patient was given opportunity to ask questions. Patient verbalized understanding of the plan and was able to repeat key elements of the plan. All questions were answered to their satisfaction.    I, Melodie Spry, NP, have reviewed all documentation for this visit. The documentation on 12/07/2024 for the exam, diagnosis, procedures, and orders are all accurate and complete.     IF YOU HAVE BEEN REFERRED TO A SPECIALIST, IT MAY TAKE 1-2 WEEKS TO SCHEDULE/PROCESS THE REFERRAL. IF YOU HAVE NOT HEARD FROM US /SPECIALIST IN TWO WEEKS, PLEASE GIVE US  A CALL AT [phone].

## 2024-01-21 ENCOUNTER — Encounter: Admitting: Family Medicine
